# Patient Record
Sex: Female | Born: 1940 | Race: White | Hispanic: No | Marital: Married | Smoking: Never smoker
Health system: Southern US, Community
[De-identification: ages and names within clinical notes are randomized; demographics above are authoritative.]

## PROBLEM LIST (undated history)

## (undated) DIAGNOSIS — K529 Noninfective gastroenteritis and colitis, unspecified: Secondary | ICD-10-CM

## (undated) DIAGNOSIS — G250 Essential tremor: Principal | ICD-10-CM

## (undated) DIAGNOSIS — Z973 Presence of spectacles and contact lenses: Secondary | ICD-10-CM

## (undated) DIAGNOSIS — G8929 Other chronic pain: Secondary | ICD-10-CM

## (undated) DIAGNOSIS — M1990 Unspecified osteoarthritis, unspecified site: Secondary | ICD-10-CM

## (undated) DIAGNOSIS — M545 Low back pain: Secondary | ICD-10-CM

## (undated) DIAGNOSIS — E669 Obesity, unspecified: Secondary | ICD-10-CM

## (undated) DIAGNOSIS — G252 Other specified forms of tremor: Principal | ICD-10-CM

## (undated) DIAGNOSIS — K219 Gastro-esophageal reflux disease without esophagitis: Secondary | ICD-10-CM

## (undated) DIAGNOSIS — Z9889 Other specified postprocedural states: Secondary | ICD-10-CM

## (undated) DIAGNOSIS — R269 Unspecified abnormalities of gait and mobility: Secondary | ICD-10-CM

## (undated) DIAGNOSIS — R112 Nausea with vomiting, unspecified: Secondary | ICD-10-CM

## (undated) DIAGNOSIS — IMO0002 Reserved for concepts with insufficient information to code with codable children: Secondary | ICD-10-CM

## (undated) HISTORY — DX: Essential tremor: G25.0

## (undated) HISTORY — DX: Gastro-esophageal reflux disease without esophagitis: K21.9

## (undated) HISTORY — DX: Other specified forms of tremor: G25.2

## (undated) HISTORY — PX: ABDOMINAL HYSTERECTOMY: SHX81

## (undated) HISTORY — DX: Other chronic pain: G89.29

## (undated) HISTORY — DX: Unspecified abnormalities of gait and mobility: R26.9

## (undated) HISTORY — DX: Obesity, unspecified: E66.9

## (undated) HISTORY — PX: BREAST SURGERY: SHX581

## (undated) HISTORY — DX: Noninfective gastroenteritis and colitis, unspecified: K52.9

## (undated) HISTORY — PX: TONSILLECTOMY: SUR1361

## (undated) HISTORY — PX: COLONOSCOPY: SHX174

## (undated) HISTORY — DX: Low back pain: M54.5

---

## 1978-09-16 HISTORY — PX: GALLBLADDER SURGERY: SHX652

## 2002-10-25 ENCOUNTER — Encounter: Payer: Self-pay | Admitting: Neurosurgery

## 2002-10-25 ENCOUNTER — Ambulatory Visit (HOSPITAL_COMMUNITY): Admission: RE | Admit: 2002-10-25 | Discharge: 2002-10-26 | Payer: Self-pay | Admitting: Neurosurgery

## 2003-09-17 HISTORY — PX: BACK SURGERY: SHX140

## 2008-09-16 HISTORY — PX: ROTATOR CUFF REPAIR: SHX139

## 2014-04-05 ENCOUNTER — Encounter: Payer: Self-pay | Admitting: Neurology

## 2014-04-05 ENCOUNTER — Ambulatory Visit (INDEPENDENT_AMBULATORY_CARE_PROVIDER_SITE_OTHER): Payer: Medicare Other | Admitting: Neurology

## 2014-04-05 VITALS — BP 135/82 | HR 73 | Ht 64.5 in | Wt 250.0 lb

## 2014-04-05 DIAGNOSIS — R269 Unspecified abnormalities of gait and mobility: Secondary | ICD-10-CM

## 2014-04-05 DIAGNOSIS — G252 Other specified forms of tremor: Principal | ICD-10-CM

## 2014-04-05 DIAGNOSIS — R6889 Other general symptoms and signs: Secondary | ICD-10-CM

## 2014-04-05 DIAGNOSIS — G250 Essential tremor: Secondary | ICD-10-CM

## 2014-04-05 DIAGNOSIS — D518 Other vitamin B12 deficiency anemias: Secondary | ICD-10-CM

## 2014-04-05 HISTORY — DX: Essential tremor: G25.0

## 2014-04-05 NOTE — Patient Instructions (Signed)

## 2014-04-05 NOTE — Progress Notes (Signed)
Reason for visit: Tremor  Angela Frederick is a 73 y.o. female  History of present illness:  Angela Frederick is a 73 year old left-handed white female with a history of chronic low back pain and obesity. She indicates a one-year history of onset of tremor that has affected both hands. The patient will note tremor when she is trying to perform handwriting, and while feeding herself. She will also have some mild tremor at rest. Both hands are affected, but sometimes 1 hand seems worse than the other. She denies any head and neck tremor or vocal tremor. She has also noted some problems with balance of the last 4-6 months. She has fallen on 3 occasions, all falls have occurred while she was outside the house working in the garden. She now uses a cane when she is gardening. She denies any significant weakness of the extremities, but she does have some troubles with chronic diarrhea that have been present for 4 to 5 years. She denies any issues controlling the bladder. She does have some chronic low back pain. She denies any family history of tremor. The patient is sent to this office for evaluation of the tremor problems and the gait instability issues.  Past Medical History  Diagnosis Date  . 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 Obesity   . Abnormality of gait   . GERD (gastroesophageal reflux disease)   . Chronic low back pain   . Chronic diarrhea     Past Surgical History  Procedure Laterality Date  . Back surgery  2005  . Rotator cuff repair  2010    right  . Abdominal hysterectomy    . Gallbladder surgery  1980  . Tonsillectomy      Family History  Problem Relation Age of Onset  . Stroke Mother   . Leukemia Father   . Lymphoma Sister   . Stroke Sister   . Dementia Sister   . Tremor Neg Hx     Social history:  reports that she has never smoked. She has never used smokeless tobacco. She reports that she drinks alcohol. She reports that she does not use  illicit drugs.  Medications:  No current outpatient prescriptions on file prior to visit.   No current facility-administered medications on file prior to visit.      Allergies  Allergen Reactions  . Sulfa Antibiotics Hives    ROS:  Out of a complete 14 system review of symptoms, the patient complains only of the following symptoms, and all other reviewed systems are negative.  Weight gain, fatigue Dizziness, difficulty swallowing Blurred vision Shortness of breath, snoring Chronic diarrhea Feeling hot, flushing Muscle cramps, achy muscles Memory loss, weakness, difficulty swallowing, dizziness, tremor Decreased energy, change in appetite Sleepiness  Blood pressure 135/82, pulse 73, height 5' 4.5" (1.638 m), weight 250 lb (113.399 kg).  Physical Exam  General: The patient is alert and cooperative at the time of the examination. The patient is markedly obese.  Eyes: Pupils are equal, round, and reactive to light. Discs are flat bilaterally.  Neck: The neck is supple, no carotid bruits are noted.  Respiratory: The respiratory examination is clear.  Cardiovascular: The cardiovascular examination reveals a regular rate and rhythm, no obvious murmurs or rubs are noted.  Skin: Extremities are without significant edema.  Neurologic Exam  Mental status: The patient is alert and oriented x 3 at the time of the examination. The patient has apparent normal recent and remote memory, with  an apparently normal attention span and concentration ability.  Cranial nerves: Facial symmetry is present. There is good sensation of the face to pinprick and soft touch bilaterally. The strength of the facial muscles and the muscles to head turning and shoulder shrug are normal bilaterally. Speech is well enunciated, no aphasia or dysarthria is noted. Extraocular movements are full. Visual fields are full. The tongue is midline, and the patient has symmetric elevation of the soft palate. No  obvious hearing deficits are noted.  Motor: The motor testing reveals 5 over 5 strength of all 4 extremities. Good symmetric motor tone is noted throughout.  Sensory: Sensory testing is intact to pinprick, soft touch, vibration sensation, and position sense on all 4 extremities, with the exception that there may be some slight decrease in vibration sensation in both feet. No evidence of extinction is noted.  Coordination: Cerebellar testing reveals good finger-nose-finger and heel-to-shin bilaterally. Minimal tremor is seen with finger-nose-finger bilaterally.  Gait and station: Gait is slightly wide-based. Tandem gait is unsteady. Romberg is negative. No drift is seen.  Reflexes: Deep tendon reflexes are symmetric, but are depressed bilaterally. Toes are downgoing bilaterally.   Assessment/Plan:  1. 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 Gait instability  3. Chronic low back pain  The patient likely has a very mild benign essential tremor. Gait issues are often times part of the syndrome of the essential tremors, but the patient has no family history of tremor. The patient will be sent for MRI evaluation of the brain, and some further blood work today. She will followup through this office in about 4 months. Treatment of the tremor may be initiated if the patient desires medication for this issue.  Jill Alexanders MD 04/05/2014 8:32 PM  [facility] [address] [place], [place] [address]  Phone [phone] Fax [phone]

## 2014-04-09 LAB — COPPER, SERUM: Copper: 130 ug/dL (ref 72–166)

## 2014-04-09 LAB — RPR: RPR: NONREACTIVE

## 2014-04-09 LAB — VITAMIN B12: Vitamin B-12: 254 pg/mL (ref 211–946)

## 2014-04-09 LAB — VITAMIN E: Vitamin E (Alpha Tocopherol): 32.5 mg/L — ABNORMAL HIGH (ref 4.6–17.8)

## 2014-04-09 LAB — TSH: TSH: 3.56 u[IU]/mL (ref 0.450–4.500)

## 2014-04-11 DIAGNOSIS — G252 Other specified forms of tremor: Secondary | ICD-10-CM

## 2014-04-11 DIAGNOSIS — G250 Essential tremor: Secondary | ICD-10-CM

## 2014-04-11 NOTE — Progress Notes (Signed)
Quick Note:  I called pt and relayed the results of the labs ( unremarkable). Will release to mychart. Husband to set up later. ______

## 2014-04-14 ENCOUNTER — Other Ambulatory Visit: Payer: Self-pay | Admitting: Neurology

## 2014-04-14 ENCOUNTER — Telehealth: Payer: Self-pay | Admitting: Neurology

## 2014-04-14 DIAGNOSIS — G250 Essential tremor: Secondary | ICD-10-CM

## 2014-04-14 DIAGNOSIS — G252 Other specified forms of tremor: Principal | ICD-10-CM

## 2014-04-14 DIAGNOSIS — R269 Unspecified abnormalities of gait and mobility: Secondary | ICD-10-CM

## 2014-04-14 NOTE — Telephone Encounter (Signed)
I called the patient. The MRI shows minimal SVD. 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 The blood work was normal. I discussed this with the patient.   MRI brain 04/14/14:  IMPRESSION: Slightly abnormal MRI scan of the brain showing mild age-appropriate changes of chronic microvascular ischemia and generalized cerebral atrophy.

## 2014-08-03 ENCOUNTER — Encounter: Payer: Self-pay | Admitting: Neurology

## 2014-08-08 ENCOUNTER — Encounter: Payer: Self-pay | Admitting: Adult Health

## 2014-08-08 ENCOUNTER — Ambulatory Visit (INDEPENDENT_AMBULATORY_CARE_PROVIDER_SITE_OTHER): Payer: Medicare Other | Admitting: Adult Health

## 2014-08-08 VITALS — BP 132/73 | HR 79 | Temp 97.4°F | Ht 64.5 in | Wt 244.0 lb

## 2014-08-08 DIAGNOSIS — G252 Other specified forms of tremor: Principal | ICD-10-CM

## 2014-08-08 DIAGNOSIS — R269 Unspecified abnormalities of gait and mobility: Secondary | ICD-10-CM

## 2014-08-08 DIAGNOSIS — G250 Essential tremor: Secondary | ICD-10-CM

## 2014-08-08 DIAGNOSIS — R251 Tremor, unspecified: Secondary | ICD-10-CM

## 2014-08-08 NOTE — Patient Instructions (Signed)

## 2014-08-08 NOTE — Progress Notes (Signed)
PATIENT: Angela Frederick DOB: 07/20/1941  REASON FOR VISIT: follow up HISTORY FROM: patient  HISTORY OF PRESENT ILLNESS: Angela Frederick is a 73 year old female with a history of benign essential tremor. She returns today for follow-up. Patient had an MRI that showed minimal small vessel disease. All blood work was unremarkable. She is currently not on any medication of the tremor. She reports that the tremor has remained the same. She continues to notice the tremor when writing or feeding herself. The tremor is located in her hands left > right. She reports that her gait has remained about the same. Her husband reports that she will not use the cane he got her. She denies any falls since the last visit. Denies any new neurological symptoms. She states that after the first of the year she is going to have tear duct surgery. She got a steroid injection in the spine about 2 months ago that she has found beneficial. She states that they have recommended surgery but she does not want to have that done at this time.   HISTORY 04/14/14 (WILLIS): 73 year old left-handed white female with a history of chronic low back pain and obesity. She indicates a one-year history of onset of tremor that has affected both hands. The patient will note tremor when she is trying to perform handwriting, and while feeding herself. She will also have some mild tremor at rest. Both hands are affected, but sometimes 1 hand seems worse than the other. She denies any head and neck tremor or vocal tremor. She has also noted some problems with balance of the last 4-6 months. She has fallen on 3 occasions, all falls have occurred while she was outside the house working in the garden. She now uses a cane when she is gardening. She denies any significant weakness of the extremities, but she does have some troubles with chronic diarrhea that have been present for 4 to 5 years. She denies any issues controlling the bladder. She does have some  chronic low back pain. She denies any family history of tremor. The patient is sent to this office for evaluation of the tremor problems and the gait instability issues  REVIEW OF SYSTEMS: Out of a complete 14 system review of symptoms, the patient complains only of the following symptoms, and all other reviewed systems are negative.  ALLERGIES: Allergies  Allergen Reactions  . Sulfa Antibiotics Hives    HOME MEDICATIONS: Outpatient Prescriptions Prior to Visit  Medication Sig Dispense Refill  . gabapentin (NEURONTIN) 100 MG capsule Take 100 mg by mouth 3 (three) times daily. 1 am and 2 pm    . meloxicam (MOBIC) 7.5 MG tablet Take 1 tablet by mouth daily.    Marland Kitchen omeprazole (PRILOSEC) 40 MG capsule Take 40 mg by mouth daily.    Earnestine Mealing 3.75 G PACK Take 1 packet by mouth daily.    . fluorometholone (FML) 0.1 % ophthalmic suspension Place 0.1 mLs into the right eye daily.      No facility-administered medications prior to visit.    PAST MEDICAL HISTORY: Past Medical History  Diagnosis Date  . 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 Obesity   . Abnormality of gait   . GERD (gastroesophageal reflux disease)   . Chronic low back pain   . Chronic diarrhea     PAST SURGICAL HISTORY: Past Surgical History  Procedure Laterality Date  . Back surgery  2005  . Rotator cuff repair  2010  right  . Abdominal hysterectomy    . Gallbladder surgery  1980  . Tonsillectomy      FAMILY HISTORY: Family History  Problem Relation Age of Onset  . Stroke Mother   . Leukemia Father   . Lymphoma Sister   . Stroke Sister   . Dementia Sister   . Tremor Neg Hx     SOCIAL HISTORY: History   Social History  . Marital Status: Married    Spouse Name: N/A    Number of Children: 2  . Years of Education: hs   Occupational History  . Retired    Social History Main Topics  . Smoking status: Never Smoker   . Smokeless tobacco: Never Used  . Alcohol Use: Yes      Comment: occasional  . Drug Use: No  . Sexual Activity: Not on file   Other Topics Concern  . Not on file   Social History Narrative      PHYSICAL EXAM  Filed Vitals:   08/08/14 1334  BP: 132/73  Pulse: 79  Temp: 97.4 F (36.3 C)  TempSrc: Oral  Height: 5' 4.5" (1.638 m)  Weight: 244 lb (110.678 kg)   Body mass index is 41.25 kg/(m^2).  Generalized: Well developed, in no acute distress   Neurological examination  Mentation: Alert oriented to time, place, history taking. Follows all commands speech and language fluent Cranial nerve II-XII: Pupils were equal round reactive to light. Extraocular movements were full, visual field were full on confrontational test. Facial sensation and strength were normal. Uvula tongue midline. Head turning and shoulder shrug  were normal and symmetric. Motor: The motor testing reveals 5 over 5 strength of all 4 extremities. Good symmetric motor tone is noted throughout.  Sensory: Sensory testing is intact to soft touch on all 4 extremities. No evidence of extinction is noted.  Coordination: Cerebellar testing reveals good finger-nose-finger and heel-to-shin bilaterally.  Gait and station: Gait is slightly wide-based. Tandem gait unsteady. Romberg is negative. No drift is seen.  Reflexes: Deep tendon reflexes are symmetric but depressed.     DIAGNOSTIC DATA (LABS, IMAGING, TESTING) - I reviewed patient records, labs, notes, testing and imaging myself where available.   Lab Results  Component Value Date   VITAMINB12 254 04/05/2014   Lab Results  Component Value Date   TSH 3.560 04/05/2014      ASSESSMENT AND PLAN 73 y.o. 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 here with:  1. Benign essential tremor  The patient's tremor has remained the same. At this  time she does not feel that she needs medication. She also feels that her gait has remained the same. On exam her gait was slightly unsteady. I recommended that she use a cane as much as possible. She verbalized understanding. If the patient's symptoms worsen or she develops new symptoms she will let us know. Otherwise she'll follow up in 6 months or sooner if needed.   Ward Givens, MSN, NP-C 08/08/2014, 1:44 PM [facility] [address] [place], [place] [address] [phone]  Note: This document was prepared with digital dictation and possible smart phrase technology. Any transcriptional errors that result from this process are unintentional.

## 2014-08-08 NOTE — Progress Notes (Signed)
I have read the note, and I agree with the clinical assessment and plan.  Angela Frederick,Angela Frederick   

## 2014-08-09 ENCOUNTER — Encounter: Payer: Self-pay | Admitting: Neurology

## 2015-02-06 ENCOUNTER — Ambulatory Visit: Payer: Medicare Other | Admitting: Neurology

## 2015-05-11 ENCOUNTER — Ambulatory Visit: Payer: Medicare Other | Admitting: Neurology

## 2015-05-11 ENCOUNTER — Encounter: Payer: Self-pay | Admitting: Adult Health

## 2015-05-11 ENCOUNTER — Ambulatory Visit (INDEPENDENT_AMBULATORY_CARE_PROVIDER_SITE_OTHER): Payer: Medicare Other | Admitting: Adult Health

## 2015-05-11 VITALS — BP 143/84 | HR 77 | Ht 64.0 in | Wt 249.0 lb

## 2015-05-11 DIAGNOSIS — G250 Essential tremor: Secondary | ICD-10-CM | POA: Diagnosis not present

## 2015-05-11 DIAGNOSIS — R269 Unspecified abnormalities of gait and mobility: Secondary | ICD-10-CM | POA: Diagnosis not present

## 2015-05-11 DIAGNOSIS — R42 Dizziness and giddiness: Secondary | ICD-10-CM

## 2015-05-11 NOTE — Patient Instructions (Addendum)
Overall you are doing well.  If dizziness with position changes persist let us know. Please use cane when ambulating. If your symptoms worsen or you develop new symptoms please let us know.

## 2015-05-11 NOTE — Progress Notes (Signed)
PATIENT: Angela Frederick DOB: [birth year]  REASON FOR VISIT: follow up- essential tremor, gait instability HISTORY FROM: patient  HISTORY OF PRESENT ILLNESS:  Angela Frederick is a 74 year old female with a history of benign essential tremor and gait instability. She returns today for evaluation. She is currently not on any medication for her tremor. She feels that her tremor has remained about the same. She feels that the left hand is worse than the right. She typically notices it the most when she is writing or feeding herself. Patient reports that her gait has remained stable. She does have a cane and has gotten better about using it more consistently. The patient states that she did have 1 fall but that was because she hit her toe on the brick of her flower bed and it made her fall forward. She did not suffer any injuries. Patient states that in the last month she's been having dizzy episodes. She states that normally occurs when she moves her head to the right or left suddenly. She states that she typically can grab on to something and the dizziness resolves. These episodes do not occur daily. She states it may occur every few days. She has had "inner ear" before and feels that these symptoms are similar. She denies any other symptoms..She returns today for an evaluation.  HISTORY 08/08/14: Angela Frederick is a 74 year old female with a history of benign essential tremor. She returns today for follow-up. Patient had an MRI that showed minimal small vessel disease. All blood work was unremarkable. She is currently not on any medication of the tremor. She reports that the tremor has remained the same. She continues to notice the tremor when writing or feeding herself. The tremor is located in her hands left > right. She reports that her gait has remained about the same. Her husband reports that she will not use the cane he got her. She denies any falls since the last visit. Denies any new neurological  symptoms. She states that after the first of the year she is going to have tear duct surgery. She got a steroid injection in the spine about 2 months ago that she has found beneficial. She states that they have recommended surgery but she does not want to have that done at this time.   HISTORY 04/14/14 (WILLIS): 74 year old left-handed white female with a history of chronic low back pain and obesity. She indicates a one-year history of onset of tremor that has affected both hands. The patient will note tremor when she is trying to perform handwriting, and while feeding herself. She will also have some mild tremor at rest. Both hands are affected, but sometimes 1 hand seems worse than the other. She denies any head and neck tremor or vocal tremor. She has also noted some problems with balance of the last 4-6 months. She has fallen on 3 occasions, all falls have occurred while she was outside the house working in the garden. She now uses a cane when she is gardening. She denies any significant weakness of the extremities, but she does have some troubles with chronic diarrhea that have been present for 4 to 5 years. She denies any issues controlling the bladder. She does have some chronic low back pain. She denies any family history of tremor. The patient is sent to this office for evaluation of the tremor problems and the gait instability issues  REVIEW OF SYSTEMS: Out of a complete 14 system review of symptoms, the patient  complains only of the following symptoms, and all other reviewed systems are negative.  Frequent waking, daytime sleepiness, dizziness  ALLERGIES: Allergies  Allergen Reactions  . Sulfa Antibiotics Hives    HOME MEDICATIONS: Outpatient Prescriptions Prior to Visit  Medication Sig Dispense Refill  . fluorometholone (FML) 0.1 % ophthalmic suspension Place 0.1 mLs into the right eye daily.     Marland Kitchen gabapentin (NEURONTIN) 100 MG capsule Take 100 mg by mouth 3 (three) times daily. 1 am  and 2 pm    . meloxicam (MOBIC) 7.5 MG tablet Take 1 tablet by mouth daily.    Marland Kitchen omeprazole (PRILOSEC) 40 MG capsule Take 40 mg by mouth daily.    Earnestine Mealing 3.75 G PACK Take 1 packet by mouth daily.     No facility-administered medications prior to visit.    PAST MEDICAL HISTORY: Past Medical History  Diagnosis Date  . 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 Obesity   . Abnormality of gait   . GERD (gastroesophageal reflux disease)   . Chronic low back pain   . Chronic diarrhea     PAST SURGICAL HISTORY: Past Surgical History  Procedure Laterality Date  . Back surgery  2005  . Rotator cuff repair  2010    right  . Abdominal hysterectomy    . Gallbladder surgery  1980  . Tonsillectomy      FAMILY HISTORY: Family History  Problem Relation Age of Onset  . Stroke Mother   . Leukemia Father   . Lymphoma Sister   . Stroke Sister   . Dementia Sister   . Tremor Neg Hx     SOCIAL HISTORY: Social History   Social History  . Marital Status: Married    Spouse Name: N/A  . Number of Children: 2  . Years of Education: hs   Occupational History  . Retired    Social History Main Topics  . Smoking status: Never Smoker   . Smokeless tobacco: Never Used  . Alcohol Use: Yes     Comment: occasional  . Drug Use: No  . Sexual Activity: Not on file   Other Topics Concern  . Not on file   Social History Narrative      PHYSICAL EXAM  Filed Vitals:   05/11/15 1427  BP: 143/84  Pulse: 77  Height: 5\' 4"  (1.626 m)  Weight: 249 lb (112.946 kg)   Body mass index is 42.72 kg/(m^2).  Generalized: Well developed, in no acute distress   Neurological examination  Mentation: Alert oriented to time, place, history taking. Follows all commands speech and language fluent Cranial nerve II-XII: Pupils were equal round reactive to light. Extraocular movements were full, visual field were full on confrontational test. Dizziness when looking to the right.  Facial sensation and strength were normal. Uvula tongue midline. Head turning and shoulder shrug  were normal and symmetric. Motor: The motor testing reveals 5 over 5 strength of all 4 extremities. Good symmetric motor tone is noted throughout. Intention tremor noted in both hands left greater than right Sensory: Sensory testing is intact to soft touch on all 4 extremities. No evidence of extinction is noted.  Coordination: Cerebellar testing reveals good finger-nose-finger and heel-to-shin bilaterally.  Gait and station: Patient uses a cane when ambulating. Tandem gait not attempted. Romberg is negative but slightly unsteady. Reflexes: Deep tendon reflexes are symmetric and normal bilaterally.   DIAGNOSTIC DATA (LABS, IMAGING, TESTING) - I reviewed patient records, labs, notes, testing and imaging  myself where available.      ASSESSMENT AND PLAN 74 y.o. 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 here with:  1. Tremor 2. Abnormality of gait 3. Dizziness   Overall the patient is doing well. Her tremor has remained stable. We will continue to monitor. The patient was again encouraged to use her cane consistently when ambulating. The patient reports a new complaint of dizziness. Her description of her dizziness sounds as if it could possibly be positional vertigo. I recommended vestibular rehabilitation. However the patient would like to wait to see if this becomes more frequent. Patient advised that if her symptoms worsen or she develops new symptoms she should let us know. Otherwise she will follow-up in 6 months or sooner if needed.    Ward Givens, MSN, NP-C 05/11/2015, 2:26 PM [facility] [address] [place],  [address] [phone]

## 2015-05-20 NOTE — Progress Notes (Signed)
I agree with the assessment and plan. Willi [place]ak A. Felecia Shelling, MD, PhD Certified in Neurology, [place] Neurophysiology, Sleep Medicine, Pain Medicine and Neuroimaging  [facility] [address], [place], [place] [address] [phone]

## 2015-10-20 ENCOUNTER — Other Ambulatory Visit (HOSPITAL_BASED_OUTPATIENT_CLINIC_OR_DEPARTMENT_OTHER): Payer: Self-pay | Admitting: Neurosurgery

## 2015-10-20 DIAGNOSIS — M5126 Other intervertebral disc displacement, lumbar region: Secondary | ICD-10-CM

## 2015-10-28 ENCOUNTER — Ambulatory Visit (HOSPITAL_BASED_OUTPATIENT_CLINIC_OR_DEPARTMENT_OTHER)
Admission: RE | Admit: 2015-10-28 | Discharge: 2015-10-28 | Disposition: A | Discharge status: Home / Self Care | Payer: Medicare Other | Source: Ambulatory Visit | Attending: Neurosurgery | Admitting: Neurosurgery

## 2015-10-28 DIAGNOSIS — M4806 Spinal stenosis, lumbar region: Secondary | ICD-10-CM | POA: Diagnosis not present

## 2015-10-28 DIAGNOSIS — M5126 Other intervertebral disc displacement, lumbar region: Secondary | ICD-10-CM | POA: Diagnosis not present

## 2015-10-28 DIAGNOSIS — M4184 Other forms of scoliosis, thoracic region: Secondary | ICD-10-CM | POA: Diagnosis not present

## 2015-10-28 DIAGNOSIS — M8938 Hypertrophy of bone, other site: Secondary | ICD-10-CM | POA: Insufficient documentation

## 2015-10-28 DIAGNOSIS — M4186 Other forms of scoliosis, lumbar region: Secondary | ICD-10-CM | POA: Insufficient documentation

## 2015-11-06 ENCOUNTER — Other Ambulatory Visit: Payer: Self-pay | Admitting: Neurosurgery

## 2015-11-13 ENCOUNTER — Encounter: Payer: Self-pay | Admitting: Adult Health

## 2015-11-13 ENCOUNTER — Ambulatory Visit (INDEPENDENT_AMBULATORY_CARE_PROVIDER_SITE_OTHER): Payer: Medicare Other | Admitting: Adult Health

## 2015-11-13 VITALS — BP 137/85 | HR 75 | Resp 16 | Ht 64.0 in | Wt 250.0 lb

## 2015-11-13 DIAGNOSIS — G4719 Other hypersomnia: Secondary | ICD-10-CM

## 2015-11-13 DIAGNOSIS — R2681 Unsteadiness on feet: Secondary | ICD-10-CM | POA: Diagnosis not present

## 2015-11-13 DIAGNOSIS — R0683 Snoring: Secondary | ICD-10-CM | POA: Diagnosis not present

## 2015-11-13 DIAGNOSIS — G250 Essential tremor: Secondary | ICD-10-CM | POA: Diagnosis not present

## 2015-11-13 NOTE — Progress Notes (Signed)
I have read the note, and I agree with the clinical assessment and plan.  WILLIS,CHARLES KEITH   

## 2015-11-13 NOTE — Patient Instructions (Signed)
We will continue to monitor tremor Consider sleep study in the future If your symptoms worsen or you develop new symptoms please let us know.

## 2015-11-13 NOTE — Progress Notes (Signed)
PATIENT: Angela Frederick DOB: 1941/07/05  REASON FOR VISIT: follow up- essential tremor, gait instability HISTORY FROM: patient  HISTORY OF PRESENT ILLNESS: Ms. Angela is a 75 year old female with a history of benign essential tremor and gait instability. She returns today for evaluation. She feels that the tremor may have gotten slightly worse. She states that is greater in the left hand is the right. She denies any trouble eating or dressing herself. She states that her handwriting has been affected. The patient has surgery planned for the lumbar spine on Monday. This will be completed by Dr. Saintclair Halsted. She is hoping this will resolve her pain which she thinks will help her tremor as well. She states that when she is in severe pain or becomes upset or anxious her tremor is worsened. She is currently not on any medication for the tremor. Patient reports that she does have some daytime sleepiness. She states that she is to not take naps but now she does. Her husband confirms that she does snore but he is not witnessed any apnea events. The patient does have oxycodone for pain however she states that she does not take this, she has severe pain which is not frequently. She returns today for an evaluation.  HISTORY 05/11/15: Ms. Frederick is a 75 year old female with a history of benign essential tremor and gait instability. She returns today for evaluation. She is currently not on any medication for her tremor. She feels that her tremor has remained about the same. She feels that the left hand is worse than the right. She typically notices it the most when she is writing or feeding herself. Patient reports that her gait has remained stable. She does have a cane and has gotten better about using it more consistently. The patient states that she did have 1 fall but that was because she hit her toe on the brick of her flower bed and it made her fall forward. She did not suffer any injuries. Patient states that  in the last month she's been having dizzy episodes. She states that normally occurs when she moves her head to the right or left suddenly. She states that she typically can grab on to something and the dizziness resolves. These episodes do not occur daily. She states it may occur every few days. She has had "inner ear" before and feels that these symptoms are similar. She denies any other symptoms..She returns today for an evaluation.  HISTORY 08/08/14: Ms. Angela Frederick is a 75 year old female with a history of benign essential tremor. She returns today for follow-up. Patient had an MRI that showed minimal small vessel disease. All blood work was unremarkable. She is currently not on any medication of the tremor. She reports that the tremor has remained the same. She continues to notice the tremor when writing or feeding herself. The tremor is located in her hands left > right. She reports that her gait has remained about the same. Her husband reports that she will not use the cane he got her. She denies any falls since the last visit. Denies any new neurological symptoms. She states that after the first of the year she is going to have tear duct surgery. She got a steroid injection in the spine about 2 months ago that she has found beneficial. She states that they have recommended surgery but she does not want to have that done at this time.   HISTORY 04/14/14 (WILLIS): 75 year old left-handed white female with a history of  chronic low back pain and obesity. She indicates a one-year history of onset of tremor that has affected both hands. The patient will note tremor when she is trying to perform handwriting, and while feeding herself. She will also have some mild tremor at rest. Both hands are affected, but sometimes 1 hand seems worse than the other. She denies any head and neck tremor or vocal tremor. She has also noted some problems with balance of the last 4-6 months. She has fallen on 3 occasions, all falls  have occurred while she was outside the house working in the garden. She now uses a cane when she is gardening. She denies any significant weakness of the extremities, but she does have some troubles with chronic diarrhea that have been present for 4 to 5 years. She denies any issues controlling the bladder. She does have some chronic low back pain. She denies any family history of tremor. The patient is sent to this office for evaluation of the tremor problems and the gait instability issues  REVIEW OF SYSTEMS: Out of a complete 14 system review of symptoms, the patient complains only of the following symptoms, and all other reviewed systems are negative.  Weakness, tremors, back pain, walking difficulty, daytime sleepiness, snoring, diarrhea, excessive eating, fatigue  ALLERGIES: Allergies  Allergen Reactions  . Sulfa Antibiotics Hives    HOME MEDICATIONS: Outpatient Prescriptions Prior to Visit  Medication Sig Dispense Refill  . gabapentin (NEURONTIN) 100 MG capsule Take 100 mg by mouth 3 (three) times daily. 1 am and 2 pm    . meloxicam (MOBIC) 7.5 MG tablet Take 1 tablet by mouth 2 (two) times daily.     Marland Kitchen omeprazole (PRILOSEC) 40 MG capsule Take 40 mg by mouth daily.    Marland Kitchen oxyCODONE (OXY IR/ROXICODONE) 5 MG immediate release tablet Take 5 mg by mouth as needed.     Earnestine Mealing 3.75 G PACK Take 1 packet by mouth every other day.      No facility-administered medications prior to visit.    PAST MEDICAL HISTORY: Past Medical History  Diagnosis Date  . 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 Obesity   . Abnormality of gait   . GERD (gastroesophageal reflux disease)   . Chronic low back pain   . Chronic diarrhea     PAST SURGICAL HISTORY: Past Surgical History  Procedure Laterality Date  . Back surgery  2005  . Rotator cuff repair  2010    right  . Abdominal hysterectomy    . Gallbladder surgery  1980  . Tonsillectomy      FAMILY HISTORY: Family History   Problem Relation Age of Onset  . Stroke Mother   . Leukemia Father   . Lymphoma Sister   . Stroke Sister   . Dementia Sister   . Tremor Neg Hx   . Aneurysm Sister     Brain    SOCIAL HISTORY: Social History   Social History  . Marital Status: Married    Spouse Name: N/A  . Number of Children: 2  . Years of Education: hs   Occupational History  . Retired    Social History Main Topics  . Smoking status: Never Smoker   . Smokeless tobacco: Never Used  . Alcohol Use: Yes     Comment: occasional  . Drug Use: No  . Sexual Activity: Not on file   Other Topics Concern  . Not on file   Social History Narrative  PHYSICAL EXAM  Filed Vitals:   11/13/15 1258  BP: 137/85  Pulse: 75  Resp: 16  Height: 5\' 4"  (1.626 m)  Weight: 250 lb (113.399 kg)   Body mass index is 42.89 kg/(m^2).  Generalized: Well developed, in no acute distress   Neurological examination  Mentation: Alert oriented to time, place, history taking. Follows all commands speech and language fluent Cranial nerve II-XII: Pupils were equal round reactive to light. Extraocular movements were full, visual field were full on confrontational test. Facial sensation and strength were normal. Uvula tongue midline. Head turning and shoulder shrug  were normal and symmetric. Motor: The motor testing reveals 5 over 5 strength of all 4 extremities. Good symmetric motor tone is noted throughout.  Sensory: Sensory testing is intact to soft touch on all 4 extremities. No evidence of extinction is noted.  Coordination: Cerebellar testing reveals good finger-nose-finger and heel-to-shin bilaterally. Intention tremor noted in the hands bilaterally Gait and station: Gait is slightly unstable. She uses a cane when in relating. Tandem gait is unsteady. Romberg is negative. Reflexes: Deep tendon reflexes are symmetric and normal bilaterally.   DIAGNOSTIC DATA (LABS, IMAGING, TESTING) - I reviewed patient records, labs,  notes, testing and imaging myself where available.    ASSESSMENT AND PLAN 75 y.o. 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 here with:  1. Benign essential tremor 2. Gait instability 3. Snoring 4. Daytime sleepiness   The patient's tremor continues to be very mild. For now we will continue to monitor this. We will not initiate any medication at this time. Patient is amenable to this plan. The patient does have a history of snoring and daytime sleepiness. She is scheduled for surgery on Monday. We may consider a sleep study in the future. Patient is advised that if her symptoms worsen or she develops any new symptoms she she'll let us know. She will follow-up in 6 months or sooner if needed.  Ward Givens, MSN, NP-C 11/13/2015, 1:10 PM [facility] [address] [place], [place] [address] [phone]

## 2015-11-14 NOTE — Pre-Procedure Instructions (Signed)
Angela Frederick  11/14/2015      [place], [place] - [address] [place] [address] Phone: [phone] Fax: [phone]  [id number] - [place], [place] - [address] [place] [address] Phone: [phone] Fax: [phone]    Your procedure is scheduled on Monday, November 20, 2015  Report to [facility] Admitting at 7:45 A.M.  Call this number if you have problems the morning of surgery:  [phone]   Remember:  Do not eat food or drink liquids after midnight Sunday, November 19, 2015  Take these medicines the morning of surgery with A SIP OF WATER :gabapentin (NEURONTIN), loratadine (CLARITIN), omeprazole (PRILOSEC), if needed:Oxycodone for pain  Stop taking Aspirin, vitamins, fish oil, and herbal medications. Do not take any NSAIDs ie: Ibuprofen, Advil, Naproxen, BC and Goody Powder, meloxicam (MOBIC) or any medication containing Aspirin; stop now.   Do not wear jewelry, make-up or nail polish.  Do not wear lotions, powders, or perfumes.  You may not wear deodorant.  Do not shave 48 hours prior to surgery.    Do not bring valuables to the hospital.  [facility] is not responsible for any belongings or valuables.  Contacts, dentures or bridgework may not be worn into surgery.  Leave your suitcase in the car.  After surgery it may be brought to your room.  For patients admitted to the hospital, discharge time will be determined by your treatment team.  Patients discharged the day of surgery will not be allowed to drive home.   Name and phone number of your driver:  Special instructions: [facility] - Preparing for Surgery  Before surgery, you can play an important role.  Because skin is not sterile, your skin needs to be as free of germs as possible.  You can reduce the number of germs on you skin by washing with CHG (chlorahexidine gluconate) soap before surgery.  CHG is an antiseptic  cleaner which kills germs and bonds with the skin to continue killing germs even after washing.  Please DO NOT use if you have an allergy to CHG or antibacterial soaps.  If your skin becomes reddened/irritated stop using the CHG and inform your nurse when you arrive at Short Stay.  Do not shave (including legs and underarms) for at least 48 hours prior to the first CHG shower.  You may shave your face.  Please follow these instructions carefully:   1.  Shower with CHG Soap the night before surgery and the morning of Surgery.  2.  If you choose to wash your hair, wash your hair first as usual with your normal shampoo.  3.  After you shampoo, rinse your hair and body thoroughly to remove the Shampoo.  4.  Use CHG as you would any other liquid soap.  You can apply chg directly  to the skin and wash gently with scrungie or a clean washcloth.  5.  Apply the CHG Soap to your body ONLY FROM THE NECK DOWN.  Do not use on open wounds or open sores.  Avoid contact with your eyes, ears, mouth and genitals (private parts).  Wash genitals (private parts) with your normal soap.  6.  Wash thoroughly, paying special attention to the area where your surgery will be performed.  7.  Thoroughly rinse your body with warm water from the neck down.  8.  DO NOT shower/wash with your normal soap after  using and rinsing off the CHG Soap.  9.  Pat yourself dry with a clean towel.            10.  Wear clean pajamas.            11.  Place clean sheets on your bed the night of your first shower and do not sleep with pets.  Day of Surgery  Do not apply any lotions/deodorants the morning of surgery.  Please wear clean clothes to the hospital/surgery center.  Please read over the following fact sheets that you were given. Pain Booklet, Coughing and Deep Breathing, MRSA Information and Surgical Site Infection Prevention

## 2015-11-15 ENCOUNTER — Encounter (HOSPITAL_COMMUNITY)
Admission: RE | Admit: 2015-11-15 | Discharge: 2015-11-15 | Disposition: A | Discharge status: Home / Self Care | Payer: Medicare Other | Source: Ambulatory Visit | Attending: Neurosurgery | Admitting: Neurosurgery

## 2015-11-15 ENCOUNTER — Encounter (HOSPITAL_COMMUNITY): Payer: Self-pay

## 2015-11-15 DIAGNOSIS — M5126 Other intervertebral disc displacement, lumbar region: Secondary | ICD-10-CM | POA: Insufficient documentation

## 2015-11-15 DIAGNOSIS — Z01812 Encounter for preprocedural laboratory examination: Secondary | ICD-10-CM | POA: Diagnosis not present

## 2015-11-15 HISTORY — DX: Other specified postprocedural states: Z98.890

## 2015-11-15 HISTORY — DX: Presence of spectacles and contact lenses: Z97.3

## 2015-11-15 HISTORY — DX: Reserved for concepts with insufficient information to code with codable children: IMO0002

## 2015-11-15 HISTORY — DX: Unspecified osteoarthritis, unspecified site: M19.90

## 2015-11-15 HISTORY — DX: Other specified postprocedural states: R11.2

## 2015-11-15 LAB — CBC
HEMATOCRIT: 44.1 % (ref 36.0–46.0)
HEMOGLOBIN: 14.3 g/dL (ref 12.0–15.0)
MCH: 27.2 pg (ref 26.0–34.0)
MCHC: 32.4 g/dL (ref 30.0–36.0)
MCV: 83.8 fL (ref 78.0–100.0)
Platelets: 228 10*3/uL (ref 150–400)
RBC: 5.26 MIL/uL — ABNORMAL HIGH (ref 3.87–5.11)
RDW: 14 % (ref 11.5–15.5)
WBC: 8.1 10*3/uL (ref 4.0–10.5)

## 2015-11-15 LAB — BASIC METABOLIC PANEL
ANION GAP: 9 (ref 5–15)
BUN: 16 mg/dL (ref 6–20)
CO2: 25 mmol/L (ref 22–32)
Calcium: 9.5 mg/dL (ref 8.9–10.3)
Chloride: 108 mmol/L (ref 101–111)
Creatinine, Ser: 0.73 mg/dL (ref 0.44–1.00)
GFR calc Af Amer: >60 mL/min (ref >60)
GLUCOSE: 110 mg/dL — AB (ref 65–99)
POTASSIUM: 4.6 mmol/L (ref 3.5–5.1)
Sodium: 142 mmol/L (ref 135–145)

## 2015-11-15 LAB — SURGICAL PCR SCREEN
MRSA, PCR: NEGATIVE
STAPHYLOCOCCUS AUREUS: NEGATIVE

## 2015-11-15 NOTE — Progress Notes (Signed)
Pt denies SOB, chest pain, and being under the care of a cardiologist. Pt denies having a stress test, echo and cardiac cath. Pt denies having a chest x ray within the last year. Pt chart forwarded to anesthesia to review EKG.

## 2015-11-20 ENCOUNTER — Inpatient Hospital Stay (HOSPITAL_COMMUNITY): Payer: Medicare Other | Admitting: Emergency Medicine

## 2015-11-20 ENCOUNTER — Inpatient Hospital Stay (HOSPITAL_COMMUNITY)
Admission: RE | Admit: 2015-11-20 | Discharge: 2015-11-21 | DRG: 519 | Disposition: A | Discharge status: Home / Self Care | Payer: Medicare Other | Source: Ambulatory Visit | Attending: Neurosurgery | Admitting: Neurosurgery

## 2015-11-20 ENCOUNTER — Inpatient Hospital Stay (HOSPITAL_COMMUNITY): Payer: Medicare Other

## 2015-11-20 ENCOUNTER — Inpatient Hospital Stay (HOSPITAL_COMMUNITY): Payer: Medicare Other | Admitting: Certified Registered Nurse Anesthetist

## 2015-11-20 ENCOUNTER — Encounter (HOSPITAL_COMMUNITY): Payer: Self-pay | Admitting: Certified Registered Nurse Anesthetist

## 2015-11-20 ENCOUNTER — Encounter (HOSPITAL_COMMUNITY)
Admission: RE | Disposition: A | Discharge status: Home / Self Care | Payer: Self-pay | Source: Ambulatory Visit | Attending: Neurosurgery

## 2015-11-20 DIAGNOSIS — Z882 Allergy status to sulfonamides status: Secondary | ICD-10-CM | POA: Diagnosis not present

## 2015-11-20 DIAGNOSIS — M48061 Spinal stenosis, lumbar region without neurogenic claudication: Secondary | ICD-10-CM | POA: Diagnosis present

## 2015-11-20 DIAGNOSIS — Z419 Encounter for procedure for purposes other than remedying health state, unspecified: Secondary | ICD-10-CM

## 2015-11-20 DIAGNOSIS — Z791 Long term (current) use of non-steroidal anti-inflammatories (NSAID): Secondary | ICD-10-CM

## 2015-11-20 DIAGNOSIS — M549 Dorsalgia, unspecified: Secondary | ICD-10-CM | POA: Diagnosis present

## 2015-11-20 DIAGNOSIS — M5116 Intervertebral disc disorders with radiculopathy, lumbar region: Secondary | ICD-10-CM | POA: Diagnosis present

## 2015-11-20 DIAGNOSIS — M4806 Spinal stenosis, lumbar region: Secondary | ICD-10-CM | POA: Diagnosis present

## 2015-11-20 DIAGNOSIS — K219 Gastro-esophageal reflux disease without esophagitis: Secondary | ICD-10-CM | POA: Diagnosis present

## 2015-11-20 DIAGNOSIS — R251 Tremor, unspecified: Secondary | ICD-10-CM | POA: Diagnosis present

## 2015-11-20 DIAGNOSIS — Z6841 Body Mass Index (BMI) 40.0 and over, adult: Secondary | ICD-10-CM | POA: Diagnosis not present

## 2015-11-20 HISTORY — PX: LUMBAR LAMINECTOMY/DECOMPRESSION MICRODISCECTOMY: SHX5026

## 2015-11-20 SURGERY — LUMBAR LAMINECTOMY/DECOMPRESSION MICRODISCECTOMY 2 LEVELS
Anesthesia: General | Site: Spine Lumbar | Laterality: Right

## 2015-11-20 MED ORDER — LIDOCAINE-EPINEPHRINE 1 %-1:100000 IJ SOLN
INTRAMUSCULAR | Status: DC | PRN
  Administered 2015-11-20: 10 mL

## 2015-11-20 MED ORDER — SODIUM CHLORIDE 0.9 % IR SOLN
Status: DC | PRN
  Administered 2015-11-20: 10:00:00

## 2015-11-20 MED ORDER — PHENOL 1.4 % MT LIQD: 1.0000 | OROMUCOSAL | Status: DC | PRN

## 2015-11-20 MED ORDER — OXYCODONE-ACETAMINOPHEN 5-325 MG PO TABS
1.0000 | ORAL_TABLET | ORAL | Status: DC | PRN
  Administered 2015-11-20 – 2015-11-21 (×3): 2 via ORAL
  Filled 2015-11-20 (×2): qty 2

## 2015-11-20 MED ORDER — CYCLOBENZAPRINE HCL 10 MG PO TABS
10.0000 mg | ORAL_TABLET | Freq: Three times a day (TID) | ORAL | Status: DC | PRN
  Administered 2015-11-20: 10 mg via ORAL
  Filled 2015-11-20: qty 1

## 2015-11-20 MED ORDER — PANTOPRAZOLE SODIUM 40 MG PO TBEC
40.0000 mg | DELAYED_RELEASE_TABLET | Freq: Every day | ORAL | Status: DC
  Administered 2015-11-21: 40 mg via ORAL
  Filled 2015-11-20: qty 1

## 2015-11-20 MED ORDER — ROCURONIUM BROMIDE 50 MG/5ML IV SOLN
INTRAVENOUS | Status: AC
  Filled 2015-11-20: qty 1

## 2015-11-20 MED ORDER — FENTANYL CITRATE (PF) 250 MCG/5ML IJ SOLN
INTRAMUSCULAR | Status: AC
  Filled 2015-11-20: qty 5

## 2015-11-20 MED ORDER — MELOXICAM 7.5 MG PO TABS
7.5000 mg | ORAL_TABLET | Freq: Two times a day (BID) | ORAL | Status: DC
  Administered 2015-11-20 – 2015-11-21 (×2): 7.5 mg via ORAL
  Filled 2015-11-20 (×2): qty 1

## 2015-11-20 MED ORDER — NEOSTIGMINE METHYLSULFATE 10 MG/10ML IV SOLN
INTRAVENOUS | Status: AC
  Filled 2015-11-20: qty 1

## 2015-11-20 MED ORDER — DEXTROSE 5 % IV SOLN
10.0000 mg | INTRAVENOUS | Status: DC | PRN
  Administered 2015-11-20: 20 ug/min via INTRAVENOUS

## 2015-11-20 MED ORDER — METHOCARBAMOL 500 MG PO TABS
ORAL_TABLET | ORAL | Status: AC
  Filled 2015-11-20: qty 1

## 2015-11-20 MED ORDER — SUGAMMADEX SODIUM 200 MG/2ML IV SOLN
INTRAVENOUS | Status: DC | PRN
  Administered 2015-11-20: 220 mg via INTRAVENOUS

## 2015-11-20 MED ORDER — LIDOCAINE HCL (CARDIAC) 20 MG/ML IV SOLN
INTRAVENOUS | Status: AC
  Filled 2015-11-20: qty 5

## 2015-11-20 MED ORDER — CHOLESTYRAMINE 4 G PO PACK
4.0000 g | PACK | Freq: Every day | ORAL | Status: DC
  Filled 2015-11-20 (×2): qty 1

## 2015-11-20 MED ORDER — PROPOFOL 10 MG/ML IV BOLUS
INTRAVENOUS | Status: AC
  Filled 2015-11-20: qty 20

## 2015-11-20 MED ORDER — ROCURONIUM BROMIDE 50 MG/5ML IV SOLN
INTRAVENOUS | Status: AC
  Filled 2015-11-20: qty 2

## 2015-11-20 MED ORDER — PHENYLEPHRINE HCL 10 MG/ML IJ SOLN
INTRAMUSCULAR | Status: DC | PRN
  Administered 2015-11-20 (×2): 160 ug via INTRAVENOUS
  Administered 2015-11-20: 80 ug via INTRAVENOUS

## 2015-11-20 MED ORDER — BUPIVACAINE HCL (PF) 0.25 % IJ SOLN
INTRAMUSCULAR | Status: DC | PRN
  Administered 2015-11-20: 10 mL

## 2015-11-20 MED ORDER — ONDANSETRON HCL 4 MG/2ML IJ SOLN
INTRAMUSCULAR | Status: DC | PRN
  Administered 2015-11-20: 4 mg via INTRAVENOUS

## 2015-11-20 MED ORDER — PHENYLEPHRINE 40 MCG/ML (10ML) SYRINGE FOR IV PUSH (FOR BLOOD PRESSURE SUPPORT)
PREFILLED_SYRINGE | INTRAVENOUS | Status: AC
  Filled 2015-11-20: qty 20

## 2015-11-20 MED ORDER — FENTANYL CITRATE (PF) 100 MCG/2ML IJ SOLN
INTRAMUSCULAR | Status: AC
  Filled 2015-11-20: qty 2

## 2015-11-20 MED ORDER — ONDANSETRON HCL 4 MG/2ML IJ SOLN
INTRAMUSCULAR | Status: AC
  Filled 2015-11-20: qty 4

## 2015-11-20 MED ORDER — ACETAMINOPHEN 325 MG PO TABS: 650.0000 mg | ORAL_TABLET | ORAL | Status: DC | PRN

## 2015-11-20 MED ORDER — ONDANSETRON HCL 4 MG/2ML IJ SOLN
INTRAMUSCULAR | Status: AC
  Filled 2015-11-20: qty 2

## 2015-11-20 MED ORDER — OXYCODONE-ACETAMINOPHEN 5-325 MG PO TABS
ORAL_TABLET | ORAL | Status: AC
  Filled 2015-11-20: qty 2

## 2015-11-20 MED ORDER — FENTANYL CITRATE (PF) 100 MCG/2ML IJ SOLN: INTRAMUSCULAR | Status: DC | PRN

## 2015-11-20 MED ORDER — HYDROMORPHONE HCL 1 MG/ML IJ SOLN: 0.5000 mg | INTRAMUSCULAR | Status: DC | PRN

## 2015-11-20 MED ORDER — CEFAZOLIN SODIUM-DEXTROSE 2-3 GM-% IV SOLR
2.0000 g | INTRAVENOUS | Status: AC
Start: 2015-11-20 — End: 2015-11-20
  Administered 2015-11-20: 2 g via INTRAVENOUS
  Filled 2015-11-20: qty 50

## 2015-11-20 MED ORDER — EPHEDRINE SULFATE 50 MG/ML IJ SOLN
INTRAMUSCULAR | Status: AC
  Filled 2015-11-20: qty 1

## 2015-11-20 MED ORDER — MENTHOL 3 MG MT LOZG: 1.0000 | LOZENGE | OROMUCOSAL | Status: DC | PRN

## 2015-11-20 MED ORDER — GLYCOPYRROLATE 0.2 MG/ML IJ SOLN
INTRAMUSCULAR | Status: AC
  Filled 2015-11-20: qty 2

## 2015-11-20 MED ORDER — DEXAMETHASONE SODIUM PHOSPHATE 10 MG/ML IJ SOLN
10.0000 mg | INTRAMUSCULAR | Status: AC
  Administered 2015-11-20: 10 mg via INTRAVENOUS
  Filled 2015-11-20: qty 1

## 2015-11-20 MED ORDER — SODIUM CHLORIDE 0.9 % IJ SOLN
INTRAMUSCULAR | Status: AC
  Filled 2015-11-20: qty 10

## 2015-11-20 MED ORDER — MIDAZOLAM HCL 2 MG/2ML IJ SOLN
INTRAMUSCULAR | Status: AC
  Filled 2015-11-20: qty 2

## 2015-11-20 MED ORDER — 0.9 % SODIUM CHLORIDE (POUR BTL) OPTIME
TOPICAL | Status: DC | PRN
  Administered 2015-11-20: 1000 mL

## 2015-11-20 MED ORDER — ONDANSETRON HCL 4 MG/2ML IJ SOLN: 4.0000 mg | INTRAMUSCULAR | Status: DC | PRN

## 2015-11-20 MED ORDER — ACETAMINOPHEN 650 MG RE SUPP: 650.0000 mg | RECTAL | Status: DC | PRN

## 2015-11-20 MED ORDER — CEFAZOLIN SODIUM 1-5 GM-% IV SOLN
1.0000 g | Freq: Three times a day (TID) | INTRAVENOUS | Status: AC
  Administered 2015-11-20 – 2015-11-21 (×2): 1 g via INTRAVENOUS
  Filled 2015-11-20 (×2): qty 50

## 2015-11-20 MED ORDER — SODIUM CHLORIDE 0.9% FLUSH
3.0000 mL | Freq: Two times a day (BID) | INTRAVENOUS | Status: DC
  Administered 2015-11-20: 3 mL via INTRAVENOUS

## 2015-11-20 MED ORDER — SODIUM CHLORIDE 0.9% FLUSH: 3.0000 mL | INTRAVENOUS | Status: DC | PRN

## 2015-11-20 MED ORDER — DEXAMETHASONE SODIUM PHOSPHATE 10 MG/ML IJ SOLN
INTRAMUSCULAR | Status: AC
  Filled 2015-11-20: qty 1

## 2015-11-20 MED ORDER — LORATADINE 10 MG PO TABS
10.0000 mg | ORAL_TABLET | Freq: Every day | ORAL | Status: DC
  Administered 2015-11-21: 10 mg via ORAL
  Filled 2015-11-20: qty 1

## 2015-11-20 MED ORDER — THROMBIN 5000 UNITS EX SOLR
CUTANEOUS | Status: DC | PRN
  Administered 2015-11-20 (×2): 5000 [IU] via TOPICAL

## 2015-11-20 MED ORDER — FENTANYL CITRATE (PF) 100 MCG/2ML IJ SOLN
25.0000 ug | INTRAMUSCULAR | Status: DC | PRN
  Administered 2015-11-20 (×3): 50 ug via INTRAVENOUS

## 2015-11-20 MED ORDER — FENTANYL CITRATE (PF) 250 MCG/5ML IJ SOLN
INTRAMUSCULAR | Status: DC | PRN
  Administered 2015-11-20: 150 ug via INTRAVENOUS

## 2015-11-20 MED ORDER — METHOCARBAMOL 500 MG PO TABS
500.0000 mg | ORAL_TABLET | Freq: Once | ORAL | Status: AC
Start: 2015-11-20 — End: 2015-11-20
  Administered 2015-11-20: 500 mg via ORAL

## 2015-11-20 MED ORDER — GABAPENTIN 300 MG PO CAPS
300.0000 mg | ORAL_CAPSULE | Freq: Two times a day (BID) | ORAL | Status: DC
  Administered 2015-11-20 – 2015-11-21 (×2): 300 mg via ORAL
  Filled 2015-11-20 (×2): qty 1

## 2015-11-20 MED ORDER — ACETAMINOPHEN 160 MG/5ML PO SOLN
650.0000 mg | Freq: Once | ORAL | Status: AC
  Administered 2015-11-20: 650 mg via ORAL
  Filled 2015-11-20: qty 20.3

## 2015-11-20 MED ORDER — LIDOCAINE HCL (CARDIAC) 20 MG/ML IV SOLN
INTRAVENOUS | Status: DC | PRN
  Administered 2015-11-20: 100 mg via INTRAVENOUS

## 2015-11-20 MED ORDER — SUCCINYLCHOLINE CHLORIDE 20 MG/ML IJ SOLN
INTRAMUSCULAR | Status: AC
  Filled 2015-11-20: qty 1

## 2015-11-20 MED ORDER — LACTATED RINGERS IV SOLN
INTRAVENOUS | Status: DC
  Administered 2015-11-20: 09:00:00 via INTRAVENOUS

## 2015-11-20 MED ORDER — CALCIUM CARBONATE-VITAMIN D 500-200 MG-UNIT PO TABS
1.0000 | ORAL_TABLET | Freq: Every day | ORAL | Status: DC
  Administered 2015-11-20 – 2015-11-21 (×2): 1 via ORAL
  Filled 2015-11-20 (×3): qty 1

## 2015-11-20 MED ORDER — PROPOFOL 10 MG/ML IV BOLUS
INTRAVENOUS | Status: DC | PRN
  Administered 2015-11-20: 170 mg via INTRAVENOUS

## 2015-11-20 MED ORDER — LIDOCAINE HCL (CARDIAC) 20 MG/ML IV SOLN
INTRAVENOUS | Status: AC
  Filled 2015-11-20: qty 10

## 2015-11-20 MED ORDER — DEXAMETHASONE SODIUM PHOSPHATE 4 MG/ML IJ SOLN
INTRAMUSCULAR | Status: AC
  Filled 2015-11-20: qty 2

## 2015-11-20 MED ORDER — ROCURONIUM BROMIDE 100 MG/10ML IV SOLN
INTRAVENOUS | Status: DC | PRN
  Administered 2015-11-20: 50 mg via INTRAVENOUS

## 2015-11-20 MED ORDER — OXYCODONE HCL 5 MG PO TABS: 5.0000 mg | ORAL_TABLET | Freq: Four times a day (QID) | ORAL | Status: DC | PRN

## 2015-11-20 SURGICAL SUPPLY — 51 items
BAG DECANTER FOR FLEXI CONT (MISCELLANEOUS) ×3 IMPLANT
BENZOIN TINCTURE PRP APPL 2/3 (GAUZE/BANDAGES/DRESSINGS) ×3 IMPLANT
BLADE CLIPPER SURG (BLADE) IMPLANT
BLADE SURG 11 STRL SS (BLADE) ×3 IMPLANT
BRUSH SCRUB EZ PLAIN DRY (MISCELLANEOUS) ×3 IMPLANT
BUR MATCHSTICK NEURO 3.0 LAGG (BURR) ×3 IMPLANT
BUR PRECISION FLUTE 6.0 (BURR) ×3 IMPLANT
CANISTER SUCT 3000ML PPV (MISCELLANEOUS) ×3 IMPLANT
CLOSURE WOUND 1/2 X4 (GAUZE/BANDAGES/DRESSINGS) ×1
DECANTER SPIKE VIAL GLASS SM (MISCELLANEOUS) ×3 IMPLANT
DRAPE LAPAROTOMY 100X72X124 (DRAPES) ×3 IMPLANT
DRAPE MICROSCOPE LEICA (MISCELLANEOUS) ×3 IMPLANT
DRAPE POUCH INSTRU U-SHP 10X18 (DRAPES) ×3 IMPLANT
DRAPE PROXIMA HALF (DRAPES) IMPLANT
DRAPE SURG 17X23 STRL (DRAPES) ×3 IMPLANT
DRSG OPSITE 4X5.5 SM (GAUZE/BANDAGES/DRESSINGS) ×3 IMPLANT
DRSG OPSITE POSTOP 4X6 (GAUZE/BANDAGES/DRESSINGS) ×3 IMPLANT
DURAPREP 26ML APPLICATOR (WOUND CARE) ×3 IMPLANT
ELECT REM PT RETURN 9FT ADLT (ELECTROSURGICAL) ×3
ELECTRODE REM PT RTRN 9FT ADLT (ELECTROSURGICAL) ×1 IMPLANT
GAUZE SPONGE 4X4 12PLY STRL (GAUZE/BANDAGES/DRESSINGS) ×3 IMPLANT
GAUZE SPONGE 4X4 16PLY XRAY LF (GAUZE/BANDAGES/DRESSINGS) IMPLANT
GLOVE BIO SURGEON STRL SZ8 (GLOVE) ×3 IMPLANT
GLOVE ECLIPSE 7.5 STRL STRAW (GLOVE) IMPLANT
GLOVE EXAM NITRILE LRG STRL (GLOVE) IMPLANT
GLOVE EXAM NITRILE MD LF STRL (GLOVE) IMPLANT
GLOVE EXAM NITRILE XL STR (GLOVE) IMPLANT
GLOVE EXAM NITRILE XS STR PU (GLOVE) IMPLANT
GLOVE INDICATOR 8.5 STRL (GLOVE) ×3 IMPLANT
GOWN STRL REUS W/ TWL LRG LVL3 (GOWN DISPOSABLE) ×1 IMPLANT
GOWN STRL REUS W/ TWL XL LVL3 (GOWN DISPOSABLE) ×2 IMPLANT
GOWN STRL REUS W/TWL 2XL LVL3 (GOWN DISPOSABLE) IMPLANT
GOWN STRL REUS W/TWL LRG LVL3 (GOWN DISPOSABLE) ×2
GOWN STRL REUS W/TWL XL LVL3 (GOWN DISPOSABLE) ×4
KIT BASIN OR (CUSTOM PROCEDURE TRAY) ×3 IMPLANT
KIT ROOM TURNOVER OR (KITS) ×3 IMPLANT
LIQUID BAND (GAUZE/BANDAGES/DRESSINGS) ×3 IMPLANT
NEEDLE HYPO 22GX1.5 SAFETY (NEEDLE) ×3 IMPLANT
NEEDLE SPNL 22GX3.5 QUINCKE BK (NEEDLE) ×3 IMPLANT
NS IRRIG 1000ML POUR BTL (IV SOLUTION) ×3 IMPLANT
PACK LAMINECTOMY NEURO (CUSTOM PROCEDURE TRAY) ×3 IMPLANT
RUBBERBAND STERILE (MISCELLANEOUS) ×6 IMPLANT
SPONGE SURGIFOAM ABS GEL SZ50 (HEMOSTASIS) ×3 IMPLANT
STRIP CLOSURE SKIN 1/2X4 (GAUZE/BANDAGES/DRESSINGS) ×2 IMPLANT
SUT VIC AB 0 CT1 18XCR BRD8 (SUTURE) ×1 IMPLANT
SUT VIC AB 0 CT1 8-18 (SUTURE) ×2
SUT VIC AB 2-0 CT1 18 (SUTURE) ×3 IMPLANT
SUT VICRYL 4-0 PS2 18IN ABS (SUTURE) ×3 IMPLANT
TOWEL OR 17X24 6PK STRL BLUE (TOWEL DISPOSABLE) ×3 IMPLANT
TOWEL OR 17X26 10 PK STRL BLUE (TOWEL DISPOSABLE) ×3 IMPLANT
WATER STERILE IRR 1000ML POUR (IV SOLUTION) ×3 IMPLANT

## 2015-11-20 NOTE — Op Note (Signed)
Preoperative diagnosis: Lumbar spinal stenosis L3-4 with lateral recess stenosis and right-sided L3 and L4 radiculopathy  2 herniated nucleus pulposus L4-5 on the right with right-sided L5 radiculopathies  Postoperative diagnosis: Same  Procedure: #1 right-sided decompressive lumbar laminotomy L3-4 partial medial facetectomies and foraminotomies of the L3 and L4 nerve root  #2 lumbar laminectomy microscope discectomy L4-5 on the right with microdissection of the right L5 nerve root microscopic discectomy  Surgeon: Dominica Severin Herman Mell  Asst.: Marland Kitchen ditty  Anesthesia: Gen.  EBL: Minimal  History of present illness: Patient is a very pleasant 75 year old female has had long-standing back pain is developing a progress worsening right greater than left leg pain with radiation down L4 and L5 nerve root pattern. Workup revealed severe lumbar spinal stenosis at L3-4 and herniated is a process at L4-5. Due to her dominance of right-sided symptoms I recommended decompressive lumbar laminectomy at L3-4 from the right and a right-sided L4-5 discectomy. I extensively went over the risks and benefits of the operation with the patient as well as perioperative course expectations of outcome and alternatives of surgery and she understood and agreed to proceed forward.  Operative procedure: Patient brought into the or was induced under general anesthesia positioned prone the Wilson frame her back was prepped and draped in routine sterile fashion. She had an old incision over L5-S1 so after infiltration of 10 mL lidocaine with epi a midline incision was made and Bovie left car was used to cut subcutaneous tissues and subperiosteal dissections care lamina of L3, L4, and L5. Interoperative x-ray confirmed with navigation proper level. So the entire lamina of L4 was drilled down part of the superior aspect of lamina of L5 and part of the inferior aspect of lamina of L3 were all removed in piecemeal fashion with 3 and 4 mm  Kerrison punch. There was marked ligamentous hypertrophy causing severe hourglass compression of thecal sac at both L3-4 and L4-5. Under microscopic illumination this was teased off of the dura with a 4 Penfield and under bitten with a 2 and 3 mm Kerrison punch. A large spur was removed from the L3-4 facet causing severe stenosis of the dorsal aspect of the L4 nerve root. Marching up I extended laminotomy superiorly well  above the 34 disc space and up to the level of the L3 foramen decompressing this foramen. up until is saw epidural fat with no further stenosis. Then marching inferiorly unroofed further the L4 foramen continue to march down identified the L5 pedicle and unroofed the L5 nerve root flush with the L5 pedicle. Then I dissected the L5 nerve root off the disc was a large herniations still partially contained within the ligament I incised the ligament removed several large free fragments of disc been into the disc space and cleaned up this. At the end the discectomy there is no further stenosis either centrally or foraminally I inspected the L3 and L4 foramens as well and confirmed adequate decompression. There was a go see her good fixing space was maintained Gelfoam was overlaid top of the dura a medium Hemovac drain was placed and the wounds closed in layers with interrupted Vicryl and skin was closed running 4 subcuticular Dermabond benzo and Steri-Strips were all applied and patient recovered in stable condition. At the end of the case all needle counts and sponge counts were correct.

## 2015-11-20 NOTE — Transfer of Care (Signed)
Immediate Anesthesia Transfer of Care Note  Patient: Angela Frederick  Procedure(s) Performed: Procedure(s): Laminectomy and Foraminotomy - L4-L5 - L3-L4 - right (Right)  Patient Location: PACU  Anesthesia Type:General  Level of Consciousness: lethargic and responds to stimulation  Airway & Oxygen Therapy: Patient Spontanous Breathing and Patient connected to nasal cannula oxygen  Post-op Assessment: Report given to RN and Patient moving all extremities X 4  Post vital signs: Reviewed and stable  Last Vitals:  Filed Vitals:   11/20/15 0809  BP: 125/75  Pulse: 76  Temp: 123XX123 C    Complications: No apparent anesthesia complications

## 2015-11-20 NOTE — Anesthesia Preprocedure Evaluation (Addendum)
Anesthesia Evaluation  Patient identified by MRN, date of birth, ID band Patient awake    Reviewed: Allergy & Precautions, H&P , Patient's Chart, lab work & pertinent test results, reviewed documented beta blocker date and time   History of Anesthesia Complications (+) PONV  Airway Mallampati: II  TM Distance: >3 FB Neck ROM: full    Dental no notable dental hx. (+) Teeth Intact, Dental Advisory Given   Pulmonary    Pulmonary exam normal breath sounds clear to auscultation       Cardiovascular  Rhythm:regular Rate:Normal     Neuro/Psych    GI/Hepatic GERD  Medicated and Controlled,  Endo/Other  Morbid obesity  Renal/GU      Musculoskeletal   Abdominal   Peds  Hematology   Anesthesia Other Findings   Reproductive/Obstetrics                            Anesthesia Physical Anesthesia Plan  ASA: III  Anesthesia Plan: General   Post-op Pain Management:    Induction: Intravenous  Airway Management Planned: Oral ETT  Additional Equipment:   Intra-op Plan:   Post-operative Plan: Extubation in OR  Informed Consent: I have reviewed the patients History and Physical, chart, labs and discussed the procedure including the risks, benefits and alternatives for the proposed anesthesia with the patient or authorized representative who has indicated his/her understanding and acceptance.   Dental Advisory Given and Dental advisory given  Plan Discussed with: CRNA and Surgeon  Anesthesia Plan Comments: (  Discussed general anesthesia, including possible nausea, instrumentation of airway, sore throat,pulmonary aspiration, etc. I asked if the were any outstanding questions, or  concerns before we proceeded. )        Anesthesia Quick Evaluation

## 2015-11-20 NOTE — H&P (Signed)
Ellsa Cebulski is an 75 y.o. female.   Chief Complaint: Back and right greater than the left leg pain HPI: Patient is a very pleasant 75 year old female who is progressive worsening back and bilateral leg pain worse on the right workup has revealed severe spinal stenosis at L3-4 and a large herniated nucleus pulposus at L4-5 on the right. Due to this is a conservative treatment imaging findings and progressive clinical syndrome I recommended decompressive laminectomy at L3-4 on the right and lumbar laminectomy microdiscectomy L4-5 on the right. I've extensively gone over the risks and benefits of the operation of patient as well as perioperative course expectations of outcome and alternatives of surgery and she understood and agreed to proceed forward.  Past Medical History  Diagnosis Date  . 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 Obesity   . Abnormality of gait   . GERD (gastroesophageal reflux disease)   . Chronic low back pain   . Chronic diarrhea   . PONV (postoperative nausea and vomiting)   . Wears glasses   . Herniated nucleus pulposus   . Arthritis     Past Surgical History  Procedure Laterality Date  . Back surgery  2005  . Rotator cuff repair  2010    right  . Abdominal hysterectomy    . Gallbladder surgery  1980  . Tonsillectomy    . Breast surgery      lumpectomy and biopsy  . Colonoscopy      Family History  Problem Relation Age of Onset  . Stroke Mother   . Leukemia Father   . Lymphoma Sister   . Stroke Sister   . Dementia Sister   . Tremor Neg Hx   . Aneurysm Sister     Brain   Social History:  reports that she has never smoked. She has never used smokeless tobacco. She reports that she drinks alcohol. She reports that she does not use illicit drugs.  Allergies:  Allergies  Allergen Reactions  . Sulfa Antibiotics Hives    Medications Prior to Admission  Medication Sig Dispense Refill  . Calcium Carbonate-Vitamin D 600-400 MG-UNIT  tablet Take 1 tablet by mouth daily.    . cholestyramine (QUESTRAN) 4 g packet Take 4 g by mouth daily.    Marland Kitchen gabapentin (NEURONTIN) 300 MG capsule Take 300 mg by mouth 2 (two) times daily.    Marland Kitchen loratadine (CLARITIN) 10 MG tablet Take 10 mg by mouth daily.    . meloxicam (MOBIC) 7.5 MG tablet Take 1 tablet by mouth 2 (two) times daily.     Marland Kitchen omeprazole (PRILOSEC) 40 MG capsule Take 40 mg by mouth daily.    Marland Kitchen oxyCODONE (OXY IR/ROXICODONE) 5 MG immediate release tablet Take 5 mg by mouth every 6 (six) hours as needed for moderate pain or severe pain.       No results found for this or any previous visit (from the past 48 hour(s)). No results found.  Review of Systems  Constitutional: Negative.   HENT: Negative.   Eyes: Negative.   Respiratory: Negative.   Cardiovascular: Negative.   Gastrointestinal: Negative.   Genitourinary: Negative.   Musculoskeletal: Positive for myalgias and back pain.  Skin: Negative.   Neurological: Positive for tingling and sensory change.  Psychiatric/Behavioral: Negative.     Blood pressure 125/75, pulse 76, temperature 98 F (36.7 C), temperature source Oral, height 5' 4.5" (1.638 m), weight 113.399 kg (250 lb), SpO2 95 %. Physical Exam  Constitutional: She is  oriented to person, place, and time. She appears well-developed and well-nourished.  HENT:  Head: Normocephalic.  Eyes: Pupils are equal, round, and reactive to light.  Neck: Normal range of motion.  Respiratory: Effort normal.  GI: Soft. Bowel sounds are normal.  Neurological: She is alert and oriented to person, place, and time. She has normal strength. GCS eye subscore is 4. GCS verbal subscore is 5. GCS motor subscore is 6.  Strength is 5 out of 5 in her iliopsoas, quads, hemorrhages, gastric, and tibialis, EHL.  Skin: Skin is warm and dry.     Assessment/Plan 75 year old female presents for decompressive laminectomy on the right at L3-4 and lumbar laminectomy microdiscectomy at L4-5 on the  right  Lindi Abram P, MD 11/20/2015, 9:29 AM

## 2015-11-20 NOTE — Anesthesia Postprocedure Evaluation (Signed)
Anesthesia Post Note  Patient: Nutritional therapist  Procedure(s) Performed: Procedure(s) (LRB): Laminectomy and Foraminotomy - L4-L5 - L3-L4 - right Lumbar Four-Five Discectomy with Lumbar Three-Four Decompression (Right)  Patient location during evaluation: PACU Anesthesia Type: General Level of consciousness: awake and alert Pain management: pain level controlled Vital Signs Assessment: post-procedure vital signs reviewed and stable Respiratory status: spontaneous breathing, nonlabored ventilation and respiratory function stable Cardiovascular status: blood pressure returned to baseline and stable Postop Assessment: no signs of nausea or vomiting Anesthetic complications: no    Last Vitals:  Filed Vitals:   11/20/15 1410 11/20/15 1609  BP: 132/74 109/55  Pulse: 75 70  Temp: 36.7 C 36.5 C  Resp: 20 18    Last Pain:  Filed Vitals:   11/20/15 1610  PainSc: 5                  Jhane Lorio A

## 2015-11-20 NOTE — Anesthesia Procedure Notes (Signed)
Procedure Name: Intubation Date/Time: 11/20/2015 9:51 AM Performed by: Barrington Ellison Pre-anesthesia Checklist: Patient identified, Emergency Drugs available, Suction available, Patient being monitored and Timeout performed Patient Re-evaluated:Patient Re-evaluated prior to inductionOxygen Delivery Method: Circle system utilized Preoxygenation: Pre-oxygenation with 100% oxygen Intubation Type: IV induction Ventilation: Mask ventilation without difficulty Laryngoscope Size: Mac and 3 Grade View: Grade II Tube type: Oral Tube size: 7.0 mm Number of attempts: 1 Airway Equipment and Method: Stylet Placement Confirmation: ETT inserted through vocal cords under direct vision,  positive ETCO2 and breath sounds checked- equal and bilateral Secured at: 21 cm Tube secured with: Tape Dental Injury: Teeth and Oropharynx as per pre-operative assessment

## 2015-11-21 ENCOUNTER — Encounter (HOSPITAL_COMMUNITY): Payer: Self-pay | Admitting: Neurosurgery

## 2015-11-21 NOTE — Progress Notes (Signed)
Patient ID: Angela Frederick, female   DOB: 07-03-1941, 75 y.o.   MRN: [id number] Doing well no leg pain discharged home

## 2015-11-21 NOTE — Discharge Instructions (Signed)

## 2015-11-21 NOTE — Discharge Summary (Signed)
  Physician Discharge Summary  Patient ID: Angela Frederick MRN: [id number] DOB/AGE: 75-Oct-1942 75 y.o.  Admit date: 11/20/2015 Discharge date: 11/21/2015  Admission Diagnoses: Lumbar spinal stenosis and herniated nevus versus L3-4 L4-5  Discharge Diagnoses: Samegood Active Problems:   Spinal stenosis at L4-L5 level   Discharged Condition: good  Hospital Course: Patient is [facility] underwent decompressive laminectomy at L3-4 and a lumbar discectomy L4-5 postoperative patient did very well with recovered in the floor on the floor she was angling and voiding spontaneously tolerating regular diet was stable for discharge home  Consults: Significant Diagnostic Studies: Treatments: Decompressive lumbar laminectomy L3-4 lumbar microscopic discectomy L4-5 on the right Discharge Exam: Blood pressure 101/53, pulse 64, temperature 98.2 F (36.8 C), temperature source Oral, resp. rate 18, height 5' 4.5" (1.638 m), weight 113.399 kg (250 lb), SpO2 93 %. Strength 5 out of 5 wound clean dry and intact  Disposition: Home     Medication List    TAKE these medications        Calcium Carbonate-Vitamin D 600-400 MG-UNIT tablet  Take 1 tablet by mouth daily.     cholestyramine 4 g packet  Commonly known as:  QUESTRAN  Take 4 g by mouth daily.     gabapentin 300 MG capsule  Commonly known as:  NEURONTIN  Take 300 mg by mouth 2 (two) times daily.     loratadine 10 MG tablet  Commonly known as:  CLARITIN  Take 10 mg by mouth daily.     meloxicam 7.5 MG tablet  Commonly known as:  MOBIC  Take 1 tablet by mouth 2 (two) times daily.     omeprazole 40 MG capsule  Commonly known as:  PRILOSEC  Take 40 mg by mouth daily.     oxyCODONE 5 MG immediate release tablet  Commonly known as:  Oxy IR/ROXICODONE  Take 5 mg by mouth every 6 (six) hours as needed for moderate pain or severe pain.           Follow-up Information    Follow up with [facility], MD.   Specialty:   Neurosurgery   Contact information:   [address] [place] [address] [phone]       Signed: Elaina Hoops 11/21/2015, 7:31 AM

## 2015-11-21 NOTE — Progress Notes (Signed)
Patient alert and oriented, mae's well, voiding adequate amount of urine, swallowing without difficulty, no c/o pain. Patient discharged home with family. Script and discharged instructions given to patient. Patient and family stated understanding of d/c instructions given and has an appointment with MD. 

## 2016-04-23 ENCOUNTER — Other Ambulatory Visit (HOSPITAL_BASED_OUTPATIENT_CLINIC_OR_DEPARTMENT_OTHER): Payer: Self-pay | Admitting: Neurosurgery

## 2016-04-23 DIAGNOSIS — M5126 Other intervertebral disc displacement, lumbar region: Secondary | ICD-10-CM

## 2016-04-27 ENCOUNTER — Ambulatory Visit (HOSPITAL_BASED_OUTPATIENT_CLINIC_OR_DEPARTMENT_OTHER)
Admission: RE | Admit: 2016-04-27 | Discharge: 2016-04-27 | Disposition: A | Discharge status: Home / Self Care | Payer: Medicare Other | Source: Ambulatory Visit | Attending: Neurosurgery | Admitting: Neurosurgery

## 2016-04-27 ENCOUNTER — Ambulatory Visit (HOSPITAL_BASED_OUTPATIENT_CLINIC_OR_DEPARTMENT_OTHER): Payer: Medicare Other

## 2016-04-27 DIAGNOSIS — M4316 Spondylolisthesis, lumbar region: Secondary | ICD-10-CM | POA: Diagnosis not present

## 2016-04-27 DIAGNOSIS — M47896 Other spondylosis, lumbar region: Secondary | ICD-10-CM | POA: Insufficient documentation

## 2016-04-27 DIAGNOSIS — M4807 Spinal stenosis, lumbosacral region: Secondary | ICD-10-CM | POA: Insufficient documentation

## 2016-04-27 DIAGNOSIS — M4806 Spinal stenosis, lumbar region: Secondary | ICD-10-CM | POA: Diagnosis not present

## 2016-04-27 DIAGNOSIS — R590 Localized enlarged lymph nodes: Secondary | ICD-10-CM | POA: Insufficient documentation

## 2016-04-27 DIAGNOSIS — M47895 Other spondylosis, thoracolumbar region: Secondary | ICD-10-CM | POA: Insufficient documentation

## 2016-04-27 DIAGNOSIS — M5126 Other intervertebral disc displacement, lumbar region: Secondary | ICD-10-CM | POA: Diagnosis present

## 2016-04-27 MED ORDER — GADOBENATE DIMEGLUMINE 529 MG/ML IV SOLN: 20.0000 mL | Freq: Once | INTRAVENOUS | Status: DC | PRN

## 2016-05-04 ENCOUNTER — Other Ambulatory Visit (HOSPITAL_BASED_OUTPATIENT_CLINIC_OR_DEPARTMENT_OTHER): Payer: Medicare Other

## 2016-05-14 ENCOUNTER — Ambulatory Visit (INDEPENDENT_AMBULATORY_CARE_PROVIDER_SITE_OTHER): Payer: Medicare Other | Admitting: Neurology

## 2016-05-14 ENCOUNTER — Encounter: Payer: Self-pay | Admitting: Neurology

## 2016-05-14 VITALS — BP 132/75 | HR 72 | Ht 64.5 in | Wt 241.0 lb

## 2016-05-14 DIAGNOSIS — G250 Essential tremor: Secondary | ICD-10-CM | POA: Diagnosis not present

## 2016-05-14 DIAGNOSIS — R269 Unspecified abnormalities of gait and mobility: Secondary | ICD-10-CM | POA: Diagnosis not present

## 2016-05-14 MED ORDER — GABAPENTIN 300 MG PO CAPS: ORAL_CAPSULE | ORAL | 1 refills | Status: DC

## 2016-05-14 NOTE — Patient Instructions (Addendum)
Fall Prevention in the Home  Falls can cause injuries and can affect people from all age groups. There are many simple things that you can do to make your home safe and to help prevent falls. WHAT CAN I DO ON THE OUTSIDE OF MY HOME?  Regularly repair the edges of walkways and driveways and fix any cracks.  Remove high doorway thresholds.  Trim any shrubbery on the main path into your home.  Use bright outdoor lighting.  Clear walkways of debris and clutter, including tools and rocks.  Regularly check that handrails are securely fastened and in good repair. Both sides of any steps should have handrails.  Install guardrails along the edges of any raised decks or porches.  Have leaves, snow, and ice cleared regularly.  Use sand or salt on walkways during winter months.  In the garage, clean up any spills right away, including grease or oil spills. WHAT CAN I DO IN THE BATHROOM?  Use night lights.  Install grab bars by the toilet and in the tub and shower. Do not use towel bars as grab bars.  Use non-skid mats or decals on the floor of the tub or shower.  If you need to sit down while you are in the shower, use a plastic, non-slip stool..  Keep the floor dry. Immediately clean up any water that spills on the floor.  Remove soap buildup in the tub or shower on a regular basis.  Attach bath mats securely with double-sided non-slip rug tape.  Remove throw rugs and other tripping hazards from the floor. WHAT CAN I DO IN THE BEDROOM?  Use night lights.  Make sure that a bedside light is easy to reach.  Do not use oversized bedding that drapes onto the floor.  Have a firm chair that has side arms to use for getting dressed.  Remove throw rugs and other tripping hazards from the floor. WHAT CAN I DO IN THE KITCHEN?   Clean up any spills right away.  Avoid walking on wet floors.  Place frequently used items in easy-to-reach places.  If you need to reach for something  above you, use a sturdy step stool that has a grab bar.  Keep electrical cables out of the way.  Do not use floor polish or wax that makes floors slippery. If you have to use wax, make sure that it is non-skid floor wax.  Remove throw rugs and other tripping hazards from the floor. WHAT CAN I DO IN THE STAIRWAYS?  Do not leave any items on the stairs.  Make sure that there are handrails on both sides of the stairs. Fix handrails that are broken or loose. Make sure that handrails are as long as the stairways.  Check any carpeting to make sure that it is firmly attached to the stairs. Fix any carpet that is loose or worn.  Avoid having throw rugs at the top or bottom of stairways, or secure the rugs with carpet tape to prevent them from moving.  Make sure that you have a light switch at the top of the stairs and the bottom of the stairs. If you do not have them, have them installed. WHAT ARE SOME OTHER FALL PREVENTION TIPS?  Wear closed-toe shoes that fit well and support your feet. Wear shoes that have rubber soles or low heels.  When you use a stepladder, make sure that it is completely opened and that the sides are firmly locked. Have someone hold the ladder while you   are using it. Do not climb a closed stepladder.  Add color or contrast paint or tape to grab bars and handrails in your home. Place contrasting color strips on the first and last steps.  Use mobility aids as needed, such as canes, walkers, scooters, and crutches.  Turn on lights if it is dark. Replace any light bulbs that burn out.  Set up furniture so that there are clear paths. Keep the furniture in the same spot.  Fix any uneven floor surfaces.  Choose a carpet design that does not hide the edge of steps of a stairway.  Be aware of any and all pets.  Review your medicines with your healthcare provider. Some medicines can cause dizziness or changes in blood pressure, which increase your risk of falling. Talk  with your health care provider about other ways that you can decrease your risk of falls. This may include working with a physical therapist or trainer to improve your strength, balance, and endurance.   This information is not intended to replace advice given to you by your health care provider. Make sure you discuss any questions you have with your health care provider.   Document Released: 08/23/2002 Document Revised: 01/17/2015 Document Reviewed: 10/07/2014 Elsevier Interactive Patient Education 2016 Elsevier Inc.  

## 2016-05-14 NOTE — Progress Notes (Signed)
Reason for visit: Tremor  Angela Frederick is an 75 y.o. female  History of present illness:  Angela Frederick is a 75 year old left-handed white female with a history of a benign essential tremor. The patient also has chronic low back pain, she is followed by Dr. Saintclair Halsted for this. The patient has had some increase in back pain and she has had a recent MRI of the low back. The patient continues to have some gradual worsening of the tremor affecting both upper extremities. She has difficulty with handwriting and feeding herself. She does have some balance issues, she uses a cane for ambulation, she denies any recent falls. She does not sleep well at night because of the pain, and therefore she has increased drowsiness during the day. The patient may nap while she is reading a book or when she is inactive during the day. The patient returns for an evaluation. She is on gabapentin taking 300 mg in the morning and 600 mg in the evening for the low back pain.  Past Medical History:  Diagnosis Date  . Abnormality of gait   . Arthritis   . Chronic diarrhea   . Chronic low back pain   . 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 GERD (gastroesophageal reflux disease)   . Herniated nucleus pulposus   . Obesity   . PONV (postoperative nausea and vomiting)   . Wears glasses     Past Surgical History:  Procedure Laterality Date  . ABDOMINAL HYSTERECTOMY    . BACK SURGERY  2005  . BREAST SURGERY     lumpectomy and biopsy  . COLONOSCOPY    . [place]  . LUMBAR LAMINECTOMY/DECOMPRESSION MICRODISCECTOMY Right 11/20/2015   Procedure: Laminectomy and Foraminotomy - L4-L5 - L3-L4 - right Lumbar Four-Five Discectomy with Lumbar Three-Four Decompression;  Surgeon: Kary Kos, MD;  Location: [facility];  Service: Neurosurgery;  Laterality: Right;  . ROTATOR CUFF REPAIR  2010   right  . TONSILLECTOMY      Family History  Problem Relation Age of Onset  . Stroke Mother     . Leukemia Father   . Lymphoma Sister   . Stroke Sister   . Dementia Sister   . Tremor Neg Hx   . Aneurysm Sister     Brain    Social history:  reports that she has never smoked. She has never used smokeless tobacco. She reports that she drinks alcohol. She reports that she does not use drugs.    Allergies  Allergen Reactions  . Sulfa Antibiotics Hives    Medications:  Prior to Admission medications   Medication Sig Start Date End Date Taking? Authorizing Provider  Calcium Carbonate-Vitamin D 600-400 MG-UNIT tablet Take 1 tablet by mouth daily.   Yes Historical Provider, MD  cholestyramine (QUESTRAN) 4 g packet Take 4 g by mouth daily.   Yes Historical Provider, MD  gabapentin (NEURONTIN) 300 MG capsule Take 300 mg by mouth 2 (two) times daily.   Yes Historical Provider, MD  loratadine (CLARITIN) 10 MG tablet Take 10 mg by mouth daily.   Yes Historical Provider, MD  meloxicam (MOBIC) 7.5 MG tablet Take 1 tablet by mouth 2 (two) times daily.  01/27/14  Yes Historical Provider, MD  omeprazole (PRILOSEC) 40 MG capsule Take 40 mg by mouth daily.   Yes Historical Provider, MD  oxyCODONE (OXY IR/ROXICODONE) 5 MG immediate release tablet Take 5 mg by mouth every 6 (six) hours as needed  for moderate pain or severe pain.  05/31/14  Yes Historical Provider, MD    ROS:  Out of a complete 14 system review of symptoms, the patient complains only of the following symptoms, and all other reviewed systems are negative.  Difficulty swallowing Rectal bleeding, diarrhea Daytime sleepiness Back pain  Blood pressure 132/75, pulse 72, height 5' 4.5" (1.638 m), weight 241 lb (109.3 kg).  Physical Exam  General: The patient is alert and cooperative at the time of the examination. The patient is markedly obese.  Skin: 1+ edema at the ankles is noted..   Neurologic Exam  Mental status: The patient is alert and oriented x 3 at the time of the examination. The patient has apparent normal recent  and remote memory, with an apparently normal attention span and concentration ability.   Cranial nerves: Facial symmetry is present. Speech is normal, no aphasia or dysarthria is noted. Extraocular movements are full. Visual fields are full.  Motor: The patient has good strength in all 4 extremities.  Sensory examination: Soft touch sensation is symmetric on the face, arms, and legs.  Coordination: The patient has good finger-nose-finger and heel-to-shin bilaterally. There is an intention tremor with finger-nose-finger bilaterally. The patient has minimal tremor withdrawn a spiral.  Gait and station: The patient has a slightly wide-based gait, the patient uses a cane for ambulation. Tandem gait was not attempted. Romberg is unsteady.  Reflexes: Deep tendon reflexes are symmetric, but are depressed.   Assessment/Plan:  1. Benign essential tremor  2. Gait disorder  3. Chronic low back pain  The patient is on gabapentin for her low back pain, we may increase this for the back and for the tremor. She will go to 300 mg in the morning at midday, 600 mg in the evening. A prescription was written for the gabapentin. She will follow-up in 6 months.  Jill Alexanders MD 05/14/2016 11:26 AM  [facility] [address] [place], [place] [address]  Phone [phone] Fax [phone]

## 2016-05-16 ENCOUNTER — Telehealth: Payer: Self-pay | Admitting: Oncology

## 2016-05-16 ENCOUNTER — Encounter: Payer: Self-pay | Admitting: Oncology

## 2016-05-16 NOTE — Telephone Encounter (Signed)
Appt scheduled with Sherrill on 9/12 at 2pm. Pt aware to arrive 30 minutes. Demographics verified. Letter to the referring and mailed to the pt.

## 2016-05-23 ENCOUNTER — Telehealth: Payer: Self-pay | Admitting: Oncology

## 2016-05-23 NOTE — Telephone Encounter (Signed)
Pt cld wanting to cancel appointment and states she will cb to reschedule

## 2016-05-28 ENCOUNTER — Ambulatory Visit: Payer: Medicare Other | Admitting: Oncology

## 2016-06-10 ENCOUNTER — Telehealth: Payer: Self-pay | Admitting: Oncology

## 2016-06-10 ENCOUNTER — Ambulatory Visit (HOSPITAL_BASED_OUTPATIENT_CLINIC_OR_DEPARTMENT_OTHER): Payer: Medicare Other

## 2016-06-10 ENCOUNTER — Ambulatory Visit (HOSPITAL_BASED_OUTPATIENT_CLINIC_OR_DEPARTMENT_OTHER): Payer: Medicare Other | Admitting: Oncology

## 2016-06-10 VITALS — BP 158/78 | HR 68 | Temp 98.1°F | Resp 16 | Ht 64.5 in | Wt 236.8 lb

## 2016-06-10 DIAGNOSIS — M545 Low back pain: Secondary | ICD-10-CM | POA: Diagnosis not present

## 2016-06-10 DIAGNOSIS — R599 Enlarged lymph nodes, unspecified: Secondary | ICD-10-CM | POA: Diagnosis present

## 2016-06-10 DIAGNOSIS — G8929 Other chronic pain: Secondary | ICD-10-CM

## 2016-06-10 DIAGNOSIS — Z801 Family history of malignant neoplasm of trachea, bronchus and lung: Secondary | ICD-10-CM

## 2016-06-10 DIAGNOSIS — R590 Localized enlarged lymph nodes: Secondary | ICD-10-CM

## 2016-06-10 DIAGNOSIS — Z807 Family history of other malignant neoplasms of lymphoid, hematopoietic and related tissues: Secondary | ICD-10-CM

## 2016-06-10 DIAGNOSIS — Z803 Family history of malignant neoplasm of breast: Secondary | ICD-10-CM

## 2016-06-10 DIAGNOSIS — K625 Hemorrhage of anus and rectum: Secondary | ICD-10-CM

## 2016-06-10 DIAGNOSIS — Z809 Family history of malignant neoplasm, unspecified: Secondary | ICD-10-CM

## 2016-06-10 LAB — COMPREHENSIVE METABOLIC PANEL
ALT: 18 U/L (ref 0–55)
ANION GAP: 10 meq/L (ref 3–11)
AST: 15 U/L (ref 5–34)
Albumin: 3.9 g/dL (ref 3.5–5.0)
Alkaline Phosphatase: 75 U/L (ref 40–150)
BILIRUBIN TOTAL: 1.27 mg/dL — AB (ref 0.20–1.20)
BUN: 23.1 mg/dL (ref 7.0–26.0)
CALCIUM: 9.4 mg/dL (ref 8.4–10.4)
CHLORIDE: 107 meq/L (ref 98–109)
CO2: 26 meq/L (ref 22–29)
CREATININE: 0.9 mg/dL (ref 0.6–1.1)
EGFR: 59 mL/min/{1.73_m2} — AB (ref >90)
Glucose: 97 mg/dl (ref 70–140)
Potassium: 4.6 mEq/L (ref 3.5–5.1)
Sodium: 142 mEq/L (ref 136–145)
TOTAL PROTEIN: 7.5 g/dL (ref 6.4–8.3)

## 2016-06-10 LAB — CBC WITH DIFFERENTIAL/PLATELET
BASO%: 0.3 % (ref 0.0–2.0)
BASOS ABS: 0 10*3/uL (ref 0.0–0.1)
EOS ABS: 0.1 10*3/uL (ref 0.0–0.5)
EOS%: 1.3 % (ref 0.0–7.0)
HEMATOCRIT: 43.9 % (ref 34.8–46.6)
HGB: 14.6 g/dL (ref 11.6–15.9)
LYMPH%: 29.5 % (ref 14.0–49.7)
MCH: 27.4 pg (ref 25.1–34.0)
MCHC: 33.3 g/dL (ref 31.5–36.0)
MCV: 82.4 fL (ref 79.5–101.0)
MONO#: 0.8 10*3/uL (ref 0.1–0.9)
MONO%: 7.9 % (ref 0.0–14.0)
NEUT%: 61 % (ref 38.4–76.8)
NEUTROS ABS: 6.2 10*3/uL (ref 1.5–6.5)
PLATELETS: 232 10*3/uL (ref 145–400)
RBC: 5.33 10*6/uL (ref 3.70–5.45)
RDW: 15 % — ABNORMAL HIGH (ref 11.2–14.5)
WBC: 10.2 10*3/uL (ref 3.9–10.3)
lymph#: 3 10*3/uL (ref 0.9–3.3)
nRBC: 0 % (ref 0–0)

## 2016-06-10 LAB — LACTATE DEHYDROGENASE: LDH: 201 U/L (ref 125–245)

## 2016-06-10 NOTE — Telephone Encounter (Signed)
Gave patient avs report and appointments for October. Central radiology scheduling will call with scan

## 2016-06-10 NOTE — Progress Notes (Signed)
[facility] Patient Consult   Referring MD: Twisha Granneman 75 y.o.  05/20/1941    Reason for Referral: Lymphadenopathy   HPI: Angela Frederick is followed by Dr. Saintclair Halsted for chronic back pain. She underwent a right sided decompressive L3-4 lumbar laminectomy and an L4-5 lumbar laminectomy on 11/20/2015. She reports recurrence of low back and left leg radicular symptoms. Dr. Saintclair Halsted obtained a repeat MRI of the lumbar spine 04/27/2016. This revealed progressive breast. Adenopathy compared to an MRI from February 2017. The largest lymph node measured 18 mm compared to 15 mm on the prior exam. Interval right laminectomy at L4-5 with decompression of the canal. She was treated with an epidural steroid injection last week for worsening left S1 radiculopathy.  Her primary physician ordered a CT of the abdomen and pelvis on 05/23/2016. No pathologic dilatation of the small bowel or colon. Retroperitoneal lymphadenopathy is noted with a 21 mm left para-aortic node. Multiple prominent nodes were seen in the left pelvis in the sigmoid mesocolon.  She is referred for evaluation of the lymphadenopathy.  Past Medical History:  Diagnosis Date  . Abnormality of gait   . Arthritis   . Chronic diarrhea   . Chronic low back pain   . 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 GERD (gastroesophageal reflux disease)   . Herniated nucleus pulposus   . Obesity   . G2 P2    . Wears glasses     Past Surgical History:  Procedure Laterality Date  . ABDOMINAL HYSTERECTOMY    . BACK SURGERY  2005  . BREAST SURGERY     lumpectomy and biopsy  . COLONOSCOPY    . [place]  . LUMBAR LAMINECTOMY/DECOMPRESSION MICRODISCECTOMY Right 11/20/2015   Procedure: Laminectomy and Foraminotomy - L4-L5 - L3-L4 - right Lumbar Four-Five Discectomy with Lumbar Three-Four Decompression;  Surgeon: Kary Kos, MD;  Location: [facility];  Service: Neurosurgery;   Laterality: Right;  . ROTATOR CUFF REPAIR  2010   right  . TONSILLECTOMY      Medications: Reviewed  Allergies:  Allergies  Allergen Reactions  . Sulfa Antibiotics Hives    Family history: Her father died of "leukemia ". A sister had non-Hodgkin's lymphoma. A granddaughter has a benign brain tumor. A maternal aunt died of breast cancer. A maternal cousin had lung cancer.  Social History:   She lives in [place]. She does not work outside of the home. She does not use cigarettes. Social alcohol use. No transfusion history. No risk factor for HIV or hepatitis.     ROS:   Positives include: Chronic back pain-improved following an epidural steroid injection September 2017, chronic diarrhea-controlled with Questran, occasional bright red blood per rectum-4 years, intentional weight loss  A complete ROS was otherwise negative.  Physical Exam:  Blood pressure (!) 158/78, pulse 68, temperature 98.1 F (36.7 C), temperature source Oral, resp. rate 16, height 5' 4.5" (1.638 m), weight 236 lb 12.8 oz (107.4 kg), SpO2 97 %.  HEENT: Oropharynx without visible mass, neck without mass Lungs: Clear bilaterally Cardiac: Regular rate and rhythm Abdomen: No hepatosplenomegaly, no mass, nontender  Vascular: No leg edema Lymph nodes: No cervical, supraclavicular, axillary, or inguinal nodes Neurologic: Alert and oriented, the motor exam appears intact in the upper and lower extremities Skin: No rash Musculoskeletal: No spine tenderness   LAB:  CBC  Lab Results  Component Value Date   WBC 10.2 06/10/2016   HGB  14.6 06/10/2016   HCT 43.9 06/10/2016   MCV 82.4 06/10/2016   PLT 232 06/10/2016   NEUTROABS 6.2 06/10/2016     CMP      Component Value Date/Time   NA 142 11/15/2015 1254   K 4.6 11/15/2015 1254   CL 108 11/15/2015 1254   CO2 25 11/15/2015 1254   GLUCOSE 110 (H) 11/15/2015 1254   BUN 16 11/15/2015 1254   CREATININE 0.73 11/15/2015 1254   CALCIUM 9.5 11/15/2015  1254   GFRNONAA >60 11/15/2015 1254   GFRAA >60 11/15/2015 1254   Imaging: As per history of present illness, CT images from 05/23/2016-not available for review today   Assessment/Plan:   1. Retroperitoneal lymphadenopathy noted an MRI of the lumbar spine 04/27/2016  CT abdomen/pelvis 05/23/2016 with retroperitoneal lymphadenopathy and prominent sigmoid colon mesocolon lymph nodes 2. Chronic back pain-status post lumbar decompressive laminectomy surgery 11/20/2015  3.   History of intermittent bright red blood per rectum-she reports last colonoscopy was 10 years ago  4.   Essential tremor  5.   G2 P2   Disposition:   Angela Frederick is followed by Dr. Saintclair Halsted for chronic back pain. She was noted to have retroperitoneal lymphadenopathy on a lumbar MRI 04/27/2016. Retroperitoneal lymphadenopathy and prominent sigmoid mesocolon nodes were noted on a CT abdomen/pelvis 05/23/2016.  The lymphadenopathy could be related to a benign condition, a lymphoproliferative disorder, or metastatic carcinoma.  I discussed the differential diagnosis with Angela Frederick and her husband. There is no other clinical evidence for lymphoma or metastatic carcinoma. The differential diagnosis includes colon cancer given the enlarged sigmoid mesocolon nodes and the report of rectal bleeding.  She will be referred for a PET scan and then return for an office visit. We will recommend a diagnostic biopsy pending the PET scan findings.  Approximately 50 minutes were spent with the patient today. The majority of the time was used for counseling and coordination of care.  Betsy Coder, MD  06/10/2016, 4:16 PM

## 2016-06-11 ENCOUNTER — Telehealth: Payer: Self-pay | Admitting: *Deleted

## 2016-06-11 ENCOUNTER — Other Ambulatory Visit: Payer: Self-pay | Admitting: Oncology

## 2016-06-11 ENCOUNTER — Inpatient Hospital Stay
Admission: RE | Admit: 2016-06-11 | Discharge: 2016-06-11 | Disposition: A | Discharge status: Home / Self Care | Payer: Self-pay | Source: Ambulatory Visit | Attending: Oncology | Admitting: Oncology

## 2016-06-11 DIAGNOSIS — C801 Malignant (primary) neoplasm, unspecified: Secondary | ICD-10-CM

## 2016-06-11 NOTE — Telephone Encounter (Signed)
-----   Message from Ladell Pier, MD sent at 06/10/2016  5:08 PM EDT ----- Please call patient, labs ok, f/u after PET

## 2016-06-11 NOTE — Telephone Encounter (Signed)
Pt informed that labs look OK, follow up after PET as discussed. She voiced understanding.

## 2016-06-24 ENCOUNTER — Ambulatory Visit (HOSPITAL_COMMUNITY)
Admission: RE | Admit: 2016-06-24 | Discharge: 2016-06-24 | Disposition: A | Discharge status: Home / Self Care | Payer: Medicare Other | Source: Ambulatory Visit | Attending: Oncology | Admitting: Oncology

## 2016-06-24 DIAGNOSIS — K5730 Diverticulosis of large intestine without perforation or abscess without bleeding: Secondary | ICD-10-CM | POA: Diagnosis not present

## 2016-06-24 DIAGNOSIS — R590 Localized enlarged lymph nodes: Secondary | ICD-10-CM | POA: Diagnosis present

## 2016-06-24 DIAGNOSIS — I700 Atherosclerosis of aorta: Secondary | ICD-10-CM | POA: Diagnosis not present

## 2016-06-24 LAB — GLUCOSE, CAPILLARY: GLUCOSE-CAPILLARY: 92 mg/dL (ref 65–99)

## 2016-06-24 MED ORDER — FLUDEOXYGLUCOSE F - 18 (FDG) INJECTION
11.8200 | Freq: Once | INTRAVENOUS | Status: AC | PRN
  Administered 2016-06-24: 11.82 via INTRAVENOUS

## 2016-06-26 ENCOUNTER — Telehealth: Payer: Self-pay | Admitting: Oncology

## 2016-06-26 ENCOUNTER — Ambulatory Visit (HOSPITAL_BASED_OUTPATIENT_CLINIC_OR_DEPARTMENT_OTHER): Payer: Medicare Other | Admitting: Oncology

## 2016-06-26 VITALS — BP 124/60 | HR 62 | Temp 98.3°F | Resp 18 | Ht 64.5 in | Wt 236.4 lb

## 2016-06-26 DIAGNOSIS — G8929 Other chronic pain: Secondary | ICD-10-CM

## 2016-06-26 DIAGNOSIS — R590 Localized enlarged lymph nodes: Secondary | ICD-10-CM | POA: Diagnosis present

## 2016-06-26 DIAGNOSIS — R251 Tremor, unspecified: Secondary | ICD-10-CM

## 2016-06-26 DIAGNOSIS — M545 Low back pain: Secondary | ICD-10-CM | POA: Diagnosis not present

## 2016-06-26 NOTE — Progress Notes (Signed)
[facility] OFFICE PROGRESS NOTE   Diagnosis: Retroperitoneal lymphadenopathy  INTERVAL HISTORY:   Angela Frederick returns as scheduled.no new complaint. Stable back pain.  Objective:  Vital signs in last 24 hours:  Blood pressure 124/60, pulse 62, temperature 98.3 F (36.8 C), temperature source Oral, resp. rate 18, height 5' 4.5" (1.638 m), weight 236 lb 6.4 oz (107.2 kg), SpO2 97 %.     Lymphatics: no inguinal or femoral nodes  GI: no mass at the anal verge, anal canal, or rectum   Lab Results:  Lab Results  Component Value Date   WBC 10.2 06/10/2016   HGB 14.6 06/10/2016   HCT 43.9 06/10/2016   MCV 82.4 06/10/2016   PLT 232 06/10/2016   NEUTROABS 6.2 06/10/2016   LDH-201 on 06/10/2016   Imaging:  Nm Pet Image Initial (pi) Skull Base To Thigh  Result Date: 06/24/2016 CLINICAL DATA:  Initial treatment strategy for retroperitoneal lymphadenopathy. EXAM: NUCLEAR MEDICINE PET SKULL BASE TO THIGH TECHNIQUE: 11.8 mCi F-18 FDG was injected intravenously. Full-ring PET imaging was performed from the skull base to thigh after the radiotracer. CT data was obtained and used for attenuation correction and anatomic localization. FASTING BLOOD GLUCOSE:  Value: 92 mg/dl COMPARISON:  CT scan 05/23/2016 FINDINGS: NECK No hypermetabolic lymph nodes in the neck. CHEST No hypermetabolic mediastinal or hilar nodes. No suspicious pulmonary nodules on the CT scan. ABDOMEN/PELVIS As seen on the previous study, there is retroperitoneal lymphadenopathy, most prominent in the left para-aortic region. Index left para-aortic lymph node seen on image 129 series 4 has short axis measurement of 16 mm. SUV max = 4.8. Other less prominent left para-aortic lymphadenopathy has similar uptake measurements. Lymph nodes identified in the mesocolon on the prior study showed low level FDG uptake with SUV max = 3.3 (see lymph nodes on image 158 of series 4). No discrete or focal hypermetabolism  identified in the left colon. There is some uptake in the anus which is normal by CT imaging in this uptake may be physiologic. Diverticulosis Of the left colon again noted. Gallbladder surgically absent. There is abdominal aortic atherosclerosis without aneurysm. SKELETON No focal hypermetabolic activity to suggest skeletal metastasis. IMPRESSION: 1. Left para-aortic and left pelvic lymph nodes show low level hypermetabolism. Neoplasm not excluded and lymphoma or metastatic disease could have this appearance. No primary hypermetabolic lesion identified on today's study. 2. Left colonic diverticulosis. 3. Abdominal aortic atherosclerosis. Electronically Signed   By: Misty Stanley M.D.   On: 06/24/2016 12:32    Medications: I have reviewed the patient's current medications.  Assessment/Plan: 1. Retroperitoneal lymphadenopathy noted an MRI of the lumbar spine 04/27/2016 ? CT abdomen/pelvis 05/23/2016 with retroperitoneal lymphadenopathy and prominent sigmoid colon mesocolon lymph nodes ? PET scan 06/24/2016-low-level hypermetabolism associated with left para-aortic and left pelvic lymph nodes. No primary hypermetabolic lesion identified. 2. Chronic back pain-status post lumbar decompressive laminectomy surgery 11/20/2015  3.   History of intermittent bright red blood per rectum-she reports last colonoscopy was 10 years ago  4.   Essential tremor  5.   G2 P2    Disposition:  She appears well. I reviewed the PET images with Ms. Dolezal and her husband. There are small mildly hypermetabolic retroperitoneal lymph nodes. She does not have symptoms to suggest lymphoma or another malignancy. There was an area of hypermetabolism at the anus, but the rectal exam was unremarkable today.  I will present her case at the GI tumor conference next week. We decided to follow her  with observation. She will return for an office visitin 3 months. We will plan for a restaging CT of the abdomen/pelvis at a  six-month interval to follow-up on the retroperitoneal lymph nodes.  Betsy Coder, MD  06/26/2016  11:09 AM

## 2016-06-26 NOTE — Telephone Encounter (Signed)
Gave patient avs report and appointments for January  °

## 2016-07-17 ENCOUNTER — Telehealth: Payer: Self-pay | Admitting: *Deleted

## 2016-07-17 NOTE — Telephone Encounter (Signed)
Refer to Dr. Ardis Hughs or Dr. Carlean Purl

## 2016-07-17 NOTE — Telephone Encounter (Signed)
Called pt informed her Dr. Benay Spice presented her case in GI conference and it was recommended she proceed with screening colonoscopy. Follow up here in January as scheduled. Pt voiced understanding. She requests referral to whichever gastroenterologist Dr. Benay Spice recommends. She declined to schedule with GI in [employer] (now retired) because they had difficulty advancing scope due to "a kink in there." Will review with MD for referral.

## 2016-07-18 ENCOUNTER — Other Ambulatory Visit: Payer: Self-pay | Admitting: *Deleted

## 2016-07-18 DIAGNOSIS — R590 Localized enlarged lymph nodes: Secondary | ICD-10-CM

## 2016-09-25 ENCOUNTER — Other Ambulatory Visit: Payer: Self-pay | Admitting: Neurology

## 2016-09-26 ENCOUNTER — Ambulatory Visit (HOSPITAL_BASED_OUTPATIENT_CLINIC_OR_DEPARTMENT_OTHER): Payer: Medicare Other | Admitting: Oncology

## 2016-09-26 ENCOUNTER — Telehealth: Payer: Self-pay | Admitting: Oncology

## 2016-09-26 VITALS — BP 127/64 | HR 76 | Temp 97.7°F | Resp 19 | Wt 235.7 lb

## 2016-09-26 DIAGNOSIS — G8929 Other chronic pain: Secondary | ICD-10-CM

## 2016-09-26 DIAGNOSIS — R599 Enlarged lymph nodes, unspecified: Secondary | ICD-10-CM | POA: Diagnosis present

## 2016-09-26 DIAGNOSIS — R590 Localized enlarged lymph nodes: Secondary | ICD-10-CM

## 2016-09-26 DIAGNOSIS — M545 Low back pain: Secondary | ICD-10-CM

## 2016-09-26 NOTE — Telephone Encounter (Signed)
Gave patient avs report and appointments for April. Per 1/11 los lab/ct/fu same day. Lab schedule 4/12 @ 12 pm and f/u @ 3:30 pm. Central radiology will call re scan.

## 2016-09-26 NOTE — Progress Notes (Signed)
  [facility] OFFICE PROGRESS NOTE   Diagnosis: Retroperitoneal lymphadenopathy  INTERVAL HISTORY:   Ms. Beauchesne returns as scheduled. She feels well. Good appetite. No palpable lymph nodes. No new complaint.  Objective:  Vital signs in last 24 hours:  Blood pressure 127/64, pulse 76, temperature 97.7 F (36.5 C), temperature source Oral, resp. rate 19, weight 235 lb 11.2 oz (106.9 kg), SpO2 97 %.    HEENT: Neck without mass Lymphatics: No cervical, supraclavicular, axillary, or inguinal nodes Resp: Lungs clear bilaterally Cardio: Regular rate and rhythm GI: No hepatosplenomegaly, no mass Vascular: No leg edema   Lab Results:  Lab Results  Component Value Date   WBC 10.2 06/10/2016   HGB 14.6 06/10/2016   HCT 43.9 06/10/2016   MCV 82.4 06/10/2016   PLT 232 06/10/2016   NEUTROABS 6.2 06/10/2016    Medications: I have reviewed the patient's current medications.  Assessment/Plan: 1. Retroperitoneal lymphadenopathy noted an MRI of the lumbar spine 04/27/2016 ? CT abdomen/pelvis 05/23/2016 with retroperitoneal lymphadenopathy and prominent sigmoid colon mesocolon lymph nodes ? PET scan 06/24/2016-low-level hypermetabolism associated with left para-aortic and left pelvic lymph nodes. No primary hypermetabolic lesion identified. 2. Chronic back pain-status post lumbar decompressive laminectomy surgery 11/20/2015  3. History of intermittent bright red blood per rectum-she reports last colonoscopy was 10 years ago  4. Essential tremor  5. G2 P2   Disposition:  She appears unchanged. No clinical evidence for development of a lymphoproliferative disorder. She will be scheduled for a repeat CT of the abdomen/pelvis and office visit in 3 months.  15 minutes were spent with the patient today. The majority of the time was used for counseling and coordination of care.  Betsy Coder, MD  09/26/2016  1:07 PM

## 2016-11-14 ENCOUNTER — Ambulatory Visit (INDEPENDENT_AMBULATORY_CARE_PROVIDER_SITE_OTHER): Payer: Medicare Other | Admitting: Adult Health

## 2016-11-14 ENCOUNTER — Encounter (INDEPENDENT_AMBULATORY_CARE_PROVIDER_SITE_OTHER): Payer: Self-pay

## 2016-11-14 ENCOUNTER — Encounter: Payer: Self-pay | Admitting: Adult Health

## 2016-11-14 VITALS — BP 108/70 | HR 76 | Ht 64.5 in | Wt 235.8 lb

## 2016-11-14 DIAGNOSIS — M545 Low back pain: Secondary | ICD-10-CM | POA: Diagnosis not present

## 2016-11-14 DIAGNOSIS — G250 Essential tremor: Secondary | ICD-10-CM | POA: Diagnosis not present

## 2016-11-14 DIAGNOSIS — G8929 Other chronic pain: Secondary | ICD-10-CM | POA: Diagnosis not present

## 2016-11-14 MED ORDER — GABAPENTIN 300 MG PO CAPS: ORAL_CAPSULE | ORAL | 3 refills | Status: DC

## 2016-11-14 NOTE — Progress Notes (Signed)
I have read the note, and I agree with the clinical assessment and plan.  Adaline Trejos KEITH   

## 2016-11-14 NOTE — Progress Notes (Signed)
PATIENT: Angela Frederick DOB: 10-05-1940  REASON FOR VISIT: follow up-essential tremor, chronic low back pain HISTORY FROM: patient  HISTORY OF PRESENT ILLNESS: Angela Frederick is a 76 year old female with a history of benign essential tremor and chronic low back pain. She returns today for follow-up. At the last visit, Dr. Jannifer Franklin increased her gabapentin to 300 mg in the morning and at noon and 600 mg in evening. She reports that she was not aware of this increase therefore she is in [place]. taking the same dosage. She states currently she is taking gabapentin 300 in the morning and 600 at night. She states that the tremor has gotten slightly worse. She states that she notices it with her handwriting. Reports that her mother recently passed away and the tremor was worse during this time. She is able to complete all ADLs independently. She continues to have problems eating with the tremor. Reports that her back pain is under good control. She returns today for an evaluation.   HISTORY 05/14/16:Angela Frederick is a 76 year old left-handed white female with a history of a benign essential tremor. The patient also has chronic low back pain, she is followed by Dr. Saintclair Halsted for this. The patient has had some increase in back pain and she has had a recent MRI of the low back. The patient continues to have some gradual worsening of the tremor affecting both upper extremities. She has difficulty with handwriting and feeding herself. She does have some balance issues, she uses a cane for ambulation, she denies any recent falls. She does not sleep well at night because of the pain, and therefore she has increased drowsiness during the day. The patient may nap while she is reading a book or when she is inactive during the day. The patient returns for an evaluation. She is on gabapentin taking 300 mg in the morning and 600 mg in the evening for the low back pain   REVIEW OF SYSTEMS: Out of a complete 14 system review of  symptoms, the patient complains only of the following symptoms, and all other reviewed systems are negative.  See history of present illness  ALLERGIES: Allergies  Allergen Reactions  . Sulfa Antibiotics Hives    HOME MEDICATIONS: Outpatient Medications Prior to Visit  Medication Sig Dispense Refill  . acetaminophen (TYLENOL) 500 MG tablet Take 500 mg by mouth every 6 (six) hours as needed.    . Calcium Carbonate-Vitamin D 600-400 MG-UNIT tablet Take 1 tablet by mouth daily.    . cholestyramine (QUESTRAN) 4 g packet Take 4 g by mouth daily.    . Cyanocobalamin (B-12) 1000 MCG/ML KIT Inject 1,000 mcg as directed every 30 (thirty) days.    . diphenhydramine-acetaminophen (TYLENOL PM) 25-500 MG TABS tablet Take 1 tablet by mouth at bedtime as needed.    . gabapentin (NEURONTIN) 300 MG capsule TAKE 1 CAPSULE BY MOUTH IN THE MORNING AND MIDDAY AND 2 CAPSULES AT NIGHT 360 capsule 3  . loratadine (CLARITIN) 10 MG tablet Take 10 mg by mouth daily as needed.     . meloxicam (MOBIC) 7.5 MG tablet Take 1 tablet by mouth 2 (two) times daily.     Marland Kitchen omeprazole (PRILOSEC) 40 MG capsule Take 40 mg by mouth daily.    Marland Kitchen oxyCODONE (OXY IR/ROXICODONE) 5 MG immediate release tablet Take 5 mg by mouth every 6 (six) hours as needed for moderate pain or severe pain.      No facility-administered medications prior to visit.  PAST MEDICAL HISTORY: Past Medical History:  Diagnosis Date  . Abnormality of gait   . Arthritis   . Chronic diarrhea   . Chronic low back pain   . 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 GERD (gastroesophageal reflux disease)   . Herniated nucleus pulposus   . Obesity   . PONV (postoperative nausea and vomiting)   . Wears glasses     PAST SURGICAL HISTORY: Past Surgical History:  Procedure Laterality Date  . ABDOMINAL HYSTERECTOMY    . BACK SURGERY  2005  . BREAST SURGERY     lumpectomy and biopsy  . COLONOSCOPY    . [place]  .  LUMBAR LAMINECTOMY/DECOMPRESSION MICRODISCECTOMY Right 11/20/2015   Procedure: Laminectomy and Foraminotomy - L4-L5 - L3-L4 - right Lumbar Four-Five Discectomy with Lumbar Three-Four Decompression;  Surgeon: Kary Kos, MD;  Location: [facility];  Service: Neurosurgery;  Laterality: Right;  . ROTATOR CUFF REPAIR  2010   right  . TONSILLECTOMY      FAMILY HISTORY: Family History  Problem Relation Age of Onset  . Stroke Mother   . Leukemia Father   . Lymphoma Sister   . Stroke Sister   . Dementia Sister   . Aneurysm Sister     Brain  . Tremor Neg Hx     SOCIAL HISTORY: Social History   Social History  . Marital status: Married    Spouse name: N/A  . Number of children: 2  . Years of education: hs   Occupational History  . Retired    Social History Main Topics  . Smoking status: Never Smoker  . Smokeless tobacco: Never Used  . Alcohol use Yes     Comment: occasional  . Drug use: No  . Sexual activity: Not on file   Other Topics Concern  . Not on file   Social History Narrative  . No narrative on file      PHYSICAL EXAM  Vitals:   11/14/16 1039  BP: 108/70  Pulse: 76  Weight: 235 lb 12.8 oz (107 kg)  Height: 5' 4.5" (1.638 m)   Body mass index is 39.85 kg/m.  Generalized: Well developed, in no acute distress   Neurological examination  Mentation: Alert oriented to time, place, history taking. Follows all commands speech and language fluent Cranial nerve II-XII: Pupils were equal round reactive to light. Extraocular movements were full, visual field were full on confrontational test. Facial sensation and strength were normal. Uvula tongue midline. Head turning and shoulder shrug  were normal and symmetric. Mild to moderate Intention tremor noted in the upper extremities. Motor: The motor testing reveals 5 over 5 strength of all 4 extremities. Good symmetric motor tone is noted throughout.  Sensory: Sensory testing is intact to soft touch on all 4  extremities. No evidence of extinction is noted.  Coordination: Cerebellar testing reveals good finger-nose-finger and heel-to-shin bilaterally.  Gait and station: Patient uses a cane when ambulating. Tandem gait not attempted. Reflexes: Deep tendon reflexes are symmetric and normal bilaterally.   DIAGNOSTIC DATA (LABS, IMAGING, TESTING) - I reviewed patient records, labs, notes, testing and imaging myself where available.  Lab Results  Component Value Date   WBC 10.2 06/10/2016   HGB 14.6 06/10/2016   HCT 43.9 06/10/2016   MCV 82.4 06/10/2016   PLT 232 06/10/2016      Component Value Date/Time   NA 142 06/10/2016 1529   K 4.6 06/10/2016 1529   CL  108 11/15/2015 1254   CO2 26 06/10/2016 1529   GLUCOSE 97 06/10/2016 1529   BUN 23.1 06/10/2016 1529   CREATININE 0.9 06/10/2016 1529   CALCIUM 9.4 06/10/2016 1529   PROT 7.5 06/10/2016 1529   ALBUMIN 3.9 06/10/2016 1529   AST 15 06/10/2016 1529   ALT 18 06/10/2016 1529   ALKPHOS 75 06/10/2016 1529   BILITOT 1.27 (H) 06/10/2016 1529   GFRNONAA >60 11/15/2015 1254   GFRAA >60 11/15/2015 1254    Lab Results  Component Value Date   VITAMINB12 254 04/05/2014   Lab Results  Component Value Date   TSH 3.560 04/05/2014      ASSESSMENT AND PLAN 76 y.o. year old female  has a past medical history of Abnormality of gait; Arthritis; Chronic diarrhea; Chronic low back pain; Essential and other specified forms of tremor (04/05/2014); GERD (gastroesophageal reflux disease); Herniated nucleus pulposus; Obesity; PONV (postoperative nausea and vomiting); and Wears glasses. here with:  1. Essential tremor 2. Chronic low back pain  Overall the patient is doing well. She does feel that the tremor has gotten slightly worse. We will increase the gabapentin to 300 mg in the morning and midday and 600 mg in the evening. This should also offer her benefit  with her back pain. Advised that if her symptoms worsen or she develops new symptoms she  should let us know. Also advised that if increasing gabapentin caused too much drowsiness she should let us know. She will follow-up in 6 months or sooner if needed.   Ward Givens, MSN, NP-C 11/14/2016, 10:41 AM [facility] [address], [place], [place] [address] [phone]

## 2016-11-14 NOTE — Patient Instructions (Signed)
Try taking Gabapentin 300 mg in the moon and midday and 600 mg at bedtime If this causes too much drowsiness please let me know If your symptoms worsen or you develop new symptoms please let us know.

## 2016-12-24 ENCOUNTER — Telehealth: Payer: Self-pay | Admitting: *Deleted

## 2016-12-24 NOTE — Telephone Encounter (Signed)
Returned patient's call with questions about CT scans scheduled 12-26-2016.  Answered questions about "difference in PET, MRI and CT scan, why test was ordered after having previous imaging test and does the test cause diarrhea.  I have problems with diarrhea and need to make sure I will have access to a bathroom and no accidents."  Encouraged to take Questran before the time she was instructed to take nothing by mouth.  Also instructed to eat vanilla yogurt and nothing high in fiber the evening before and morning of CT scan.

## 2016-12-26 ENCOUNTER — Other Ambulatory Visit (HOSPITAL_BASED_OUTPATIENT_CLINIC_OR_DEPARTMENT_OTHER): Payer: Medicare Other

## 2016-12-26 ENCOUNTER — Ambulatory Visit (HOSPITAL_BASED_OUTPATIENT_CLINIC_OR_DEPARTMENT_OTHER): Payer: Medicare Other | Admitting: Oncology

## 2016-12-26 ENCOUNTER — Ambulatory Visit (HOSPITAL_COMMUNITY)
Admission: RE | Admit: 2016-12-26 | Discharge: 2016-12-26 | Disposition: A | Discharge status: Home / Self Care | Payer: Medicare Other | Source: Ambulatory Visit | Attending: Oncology | Admitting: Oncology

## 2016-12-26 VITALS — BP 114/62 | HR 69 | Temp 98.0°F | Resp 18 | Ht 64.5 in | Wt 238.3 lb

## 2016-12-26 DIAGNOSIS — I700 Atherosclerosis of aorta: Secondary | ICD-10-CM | POA: Insufficient documentation

## 2016-12-26 DIAGNOSIS — R590 Localized enlarged lymph nodes: Secondary | ICD-10-CM | POA: Diagnosis present

## 2016-12-26 DIAGNOSIS — G8929 Other chronic pain: Secondary | ICD-10-CM | POA: Diagnosis not present

## 2016-12-26 DIAGNOSIS — M545 Low back pain: Secondary | ICD-10-CM

## 2016-12-26 LAB — BASIC METABOLIC PANEL
Anion Gap: 8 mEq/L (ref 3–11)
BUN: 22.8 mg/dL (ref 7.0–26.0)
CALCIUM: 9.5 mg/dL (ref 8.4–10.4)
CO2: 27 meq/L (ref 22–29)
Chloride: 106 mEq/L (ref 98–109)
Creatinine: 0.8 mg/dL (ref 0.6–1.1)
EGFR: 77 mL/min/{1.73_m2} — AB (ref >90)
GLUCOSE: 98 mg/dL (ref 70–140)
POTASSIUM: 4.6 meq/L (ref 3.5–5.1)
Sodium: 142 mEq/L (ref 136–145)

## 2016-12-26 MED ORDER — IOPAMIDOL (ISOVUE-300) INJECTION 61%
100.0000 mL | Freq: Once | INTRAVENOUS | Status: AC | PRN
  Administered 2016-12-26: 100 mL via INTRAVENOUS

## 2016-12-26 MED ORDER — IOPAMIDOL (ISOVUE-300) INJECTION 61%
INTRAVENOUS | Status: AC
  Filled 2016-12-26: qty 100

## 2016-12-26 NOTE — Progress Notes (Signed)
  [facility] OFFICE PROGRESS NOTE   Diagnosis: Retroperitoneal lymphadenopathy  INTERVAL HISTORY:   Ms. Ayllon returns as scheduled. No fever or night sweats. Chronic back pain. No other pain. She developed diarrhea after drinking the CT contrast today. No dyspnea or consistent cough.  Objective:  Vital signs in last 24 hours:  Blood pressure 114/62, pulse 69, temperature 98 F (36.7 C), temperature source Oral, resp. rate 18, height 5' 4.5" (1.638 m), weight 238 lb 4.8 oz (108.1 kg), SpO2 96 %.    HEENT: Neck without mass Lymphatics: No cervical, supraclavicular, axillary, or inguinal nodes Resp: Inspiratory rhonchi at the left greater than right posterior base, no respiratory distress Cardio: Regular rate and rhythm GI: No hepatosplenomegaly, no mass Vascular: No leg edema   Lab Results:  Lab Results  Component Value Date   WBC 10.2 06/10/2016   HGB 14.6 06/10/2016   HCT 43.9 06/10/2016   MCV 82.4 06/10/2016   PLT 232 06/10/2016   NEUTROABS 6.2 06/10/2016     Medications: I have reviewed the patient's current medications.  Assessment/Plan: 1. Retroperitoneal lymphadenopathy noted an MRI of the lumbar spine 04/27/2016 ? CT abdomen/pelvis 05/23/2016 with retroperitoneal lymphadenopathy and prominent sigmoid colon mesocolon lymph nodes ? PET scan 06/24/2016-low-level hypermetabolism associated with left para-aortic and left pelvic lymph nodes. No primary hypermetabolic lesion identified. 2. Chronic back pain-status post lumbar decompressive laminectomy surgery 11/20/2015  3. History of intermittent bright red blood per rectum-she reports last colonoscopy was 10 years ago  4. Essential tremor  5. G2 P2    Disposition:  She appears unchanged. There is no clinical evidence for development of a lymphoproliferative disorder. I reviewed the CT images from today with Ms. Bachtell and her husband. The retroperitoneal lymph nodes do not appear  significantly changed compared to the CT from September 2017. We will contact her when the final report is available. The plan is to continue observation. She will return for an office visit in 6 months.    Betsy Coder, MD  12/26/2016  4:25 PM

## 2016-12-27 ENCOUNTER — Telehealth: Payer: Self-pay | Admitting: *Deleted

## 2016-12-27 NOTE — Telephone Encounter (Signed)
-----   Message from Angela Pier, MD sent at 12/26/2016  8:34 PM EDT ----- Please call patient, CT shows stable lymph nodes, f/u as scheduled

## 2016-12-27 NOTE — Telephone Encounter (Signed)
Called pt with CT results. Stable, per MD. Pt understands to follow up in 6 months as scheduled. She stated she does not want another CT.

## 2017-02-24 ENCOUNTER — Telehealth: Payer: Self-pay | Admitting: Adult Health

## 2017-02-24 NOTE — Telephone Encounter (Signed)
Patients husband called office in reference to patient being dizzy, balance is terrible, had a nagging headache symptoms have been present since Friday.  Please call husband would like patient to be seen.  If patient can not be reached on his mobile number please contact [phone]

## 2017-02-24 NOTE — Telephone Encounter (Signed)
It appears that Angela Blossom, NP has already spoken to this pt today. See other telephone note.

## 2017-02-24 NOTE — Telephone Encounter (Signed)
I called the patient. Spoke to the patient and her husband. They state that starting on Friday evening she began to have some dizziness She states Saturday morning she could "hardly walk" when she woke up. They report that this was due to dizziness. She denies the room spinning but describes the dizziness as "feeling drunk." She states that she is also had a nagging headache since Friday. Denies weakness in extremities and denies any sensory changes. States that the headache has never been severe. She is concerned that she may have had a stroke. I instructed the patient to go to the emergency room however she refused. I did explain the importance of being evaluated immeditely especially if she felt that she had had a stoke. She voiced understanding the states that she would rather have an appointment with our office. Dr. Jannifer Franklin has an office visit open on Wednesday.  She has an appointment scheduled June 13 at 9:00 with Dr. Jannifer Franklin. I again reiterated the importance of her going to the emergency room sine this was a sudden change in her symptoms,patient voiced understanding.

## 2017-02-24 NOTE — Telephone Encounter (Signed)
Noted  

## 2017-02-26 ENCOUNTER — Ambulatory Visit: Payer: Medicare Other | Admitting: Neurology

## 2017-05-20 ENCOUNTER — Ambulatory Visit: Payer: Medicare Other | Admitting: Adult Health

## 2017-06-10 ENCOUNTER — Ambulatory Visit (INDEPENDENT_AMBULATORY_CARE_PROVIDER_SITE_OTHER): Payer: Medicare Other | Admitting: Adult Health

## 2017-06-10 ENCOUNTER — Encounter: Payer: Self-pay | Admitting: Adult Health

## 2017-06-10 VITALS — BP 138/86 | HR 72 | Wt 239.2 lb

## 2017-06-10 DIAGNOSIS — R269 Unspecified abnormalities of gait and mobility: Secondary | ICD-10-CM

## 2017-06-10 DIAGNOSIS — G250 Essential tremor: Secondary | ICD-10-CM | POA: Diagnosis not present

## 2017-06-10 MED ORDER — GABAPENTIN 300 MG PO CAPS: ORAL_CAPSULE | ORAL | 3 refills | Status: DC

## 2017-06-10 NOTE — Progress Notes (Signed)
I have read the note, and I agree with the clinical assessment and plan.  Thomasa Heidler KEITH   

## 2017-06-10 NOTE — Progress Notes (Signed)
PATIENT: Angela Frederick DOB: 1940/10/22  REASON FOR VISIT: follow up- benign essential tremor HISTORY FROM: patient  HISTORY OF PRESENT ILLNESS: Today 06/10/17 Ms. Angela Frederick is a 76 year old female with a history of benign essential tremor, chronic low back pain abnormality of gait. She returns today for follow-up.  reports that she feels that she has gotten worse. States that she is now having to use a spoon when eating due to the tremor. He reports that he feels her balance is worse however the patient doesn't necessarily feel that way. She's had 2 falls recently. He states that the worse [place] beach. She does do water aerobics weekly. She denies any dizzy episodes. She continues on gabapentin 300 mg in the morning 600 mg at night. She states that she never increased the gabapentin as she cannot tolerate the drowsiness. She uses a cane when ambulating. She returns today for an evaluation.  HISTORY Angela Frederick is a 76 year old female with a history of benign essential tremor and chronic low back pain. She returns today for follow-up. At the last visit, Dr. Jannifer Franklin increased her gabapentin to 300 mg in the morning and at noon and 600 mg in evening. She reports that she was not aware of this increase therefore she is in [place]. taking the same dosage. She states currently she is taking gabapentin 300 in the morning and 600 at night. She states that the tremor has gotten slightly worse. She states that she notices it with her handwriting. Reports that her mother recently passed away and the tremor was worse during this time. She is able to complete all ADLs independently. She continues to have problems eating with the tremor. Reports that her back pain is under good control. She returns today for an evaluation.   REVIEW OF SYSTEMS: Out of a complete 14 system review of symptoms, the patient complains only of the following symptoms, and all other reviewed systems are negative.  Frequency of  urination, tremors, walking difficulty, neck pain, daytime sleepiness, decreased concentration, eye pain, blurred vision, diarrhea, eye discharge  ALLERGIES: Allergies  Allergen Reactions  . Sulfa Antibiotics Hives    HOME MEDICATIONS: Outpatient Medications Prior to Visit  Medication Sig Dispense Refill  . acetaminophen (TYLENOL) 500 MG tablet Take 500 mg by mouth every 6 (six) hours as needed.    . Calcium Carbonate-Vitamin D 600-400 MG-UNIT tablet Take 1 tablet by mouth daily.    . cholestyramine (QUESTRAN) 4 g packet Take 4 g by mouth daily.    . Cyanocobalamin (B-12) 1000 MCG/ML KIT Inject 1,000 mcg as directed every 30 (thirty) days.    . diphenhydramine-acetaminophen (TYLENOL PM) 25-500 MG TABS tablet Take 1 tablet by mouth at bedtime as needed.    . gabapentin (NEURONTIN) 300 MG capsule TAKE 1 CAPSULE BY MOUTH IN THE MORNING AND MIDDAY AND 2 CAPSULES AT NIGHT 360 capsule 3  . loratadine (CLARITIN) 10 MG tablet Take 10 mg by mouth daily as needed.     . meloxicam (MOBIC) 7.5 MG tablet Take 1 tablet by mouth 2 (two) times daily.     Marland Kitchen omeprazole (PRILOSEC) 40 MG capsule Take 40 mg by mouth daily.    Marland Kitchen oxyCODONE (OXY IR/ROXICODONE) 5 MG immediate release tablet Take 5 mg by mouth every 6 (six) hours as needed for moderate pain or severe pain.      No facility-administered medications prior to visit.     PAST MEDICAL HISTORY: Past Medical History:  Diagnosis Date  .  Abnormality of gait   . Arthritis   . Chronic diarrhea   . Chronic low back pain   . 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 GERD (gastroesophageal reflux disease)   . Herniated nucleus pulposus   . Obesity   . PONV (postoperative nausea and vomiting)   . Wears glasses     PAST SURGICAL HISTORY: Past Surgical History:  Procedure Laterality Date  . ABDOMINAL HYSTERECTOMY    . BACK SURGERY  2005  . BREAST SURGERY     lumpectomy and biopsy  . COLONOSCOPY    . [place]    . LUMBAR LAMINECTOMY/DECOMPRESSION MICRODISCECTOMY Right 11/20/2015   Procedure: Laminectomy and Foraminotomy - L4-L5 - L3-L4 - right Lumbar Four-Five Discectomy with Lumbar Three-Four Decompression;  Surgeon: Kary Kos, MD;  Location: [facility];  Service: Neurosurgery;  Laterality: Right;  . ROTATOR CUFF REPAIR  2010   right  . TONSILLECTOMY      FAMILY HISTORY: Family History  Problem Relation Age of Onset  . Stroke Mother   . Leukemia Father   . Lymphoma Sister   . Stroke Sister   . Dementia Sister   . Tremor Neg Hx     SOCIAL HISTORY: Social History   Social History  . Marital status: Married    Spouse name: Fritz Pickerel  . Number of children: 2  . Years of education: hs   Occupational History  . Retired    Social History Main Topics  . Smoking status: Never Smoker  . Smokeless tobacco: Never Used  . Alcohol use Yes     Comment: occasional  . Drug use: No  . Sexual activity: Not on file   Other Topics Concern  . Not on file   Social History Narrative   Lives with husband      PHYSICAL EXAM  Vitals:   06/10/17 1054  BP: 138/86  Pulse: 72  Weight: 239 lb 3.2 oz (108.5 kg)   Body mass index is 40.42 kg/m.   MMSE - Mini Mental State Exam 05/11/2015  Not completed: (No Data)  Orientation to time 4  Orientation to Place 5  Registration 3  Attention/ Calculation 4  Recall 0  Language- name 2 objects 2  Language- repeat 1  Language- follow 3 step command 3  Language- read & follow direction 1  Write a sentence 1  Copy design 1  Total score 25     Generalized: Well developed, in no acute distress   Neurological examination  Mentation: Alert oriented to time, place, history taking. Follows all commands speech and language fluent Cranial nerve II-XII: Pupils were equal round reactive to light. Extraocular movements were full, visual field were full on confrontational test. Facial sensation and strength were normal. Uvula tongue midline. Head turning  and shoulder shrug  were normal and symmetric. Motor: The motor testing reveals 5 over 5 strength of all 4 extremities. Good symmetric motor tone is noted throughout.  Sensory: Sensory testing is intact to soft touch on all 4 extremities. No evidence of extinction is noted.  Coordination: Cerebellar testing reveals good finger-nose-finger and heel-to-shin bilaterally. Mild tremor in the hands bilaterally Gait and station: Patient uses a cane when ambulating. Tandem gait not attempted. Reflexes: Deep tendon reflexes are symmetric and normal bilaterally.   DIAGNOSTIC DATA (LABS, IMAGING, TESTING) - I reviewed patient records, labs, notes, testing and imaging myself where available.  Lab Results  Component Value Date   WBC 10.2 06/10/2016  HGB 14.6 06/10/2016   HCT 43.9 06/10/2016   MCV 82.4 06/10/2016   PLT 232 06/10/2016      Component Value Date/Time   NA 142 12/26/2016 1228   K 4.6 12/26/2016 1228   CL 108 11/15/2015 1254   CO2 27 12/26/2016 1228   GLUCOSE 98 12/26/2016 1228   BUN 22.8 12/26/2016 1228   CREATININE 0.8 12/26/2016 1228   CALCIUM 9.5 12/26/2016 1228   PROT 7.5 06/10/2016 1529   ALBUMIN 3.9 06/10/2016 1529   AST 15 06/10/2016 1529   ALT 18 06/10/2016 1529   ALKPHOS 75 06/10/2016 1529   BILITOT 1.27 (H) 06/10/2016 1529   GFRNONAA >60 11/15/2015 1254   GFRAA >60 11/15/2015 1254    Lab Results  Component Value Date   VITAMINB12 254 04/05/2014   Lab Results  Component Value Date   TSH 3.560 04/05/2014      ASSESSMENT AND PLAN 76 y.o. year old female  has a past medical history of Abnormality of gait; Arthritis; Chronic diarrhea; Chronic low back pain; Essential and other specified forms of tremor (04/05/2014); GERD (gastroesophageal reflux disease); Herniated nucleus pulposus; Obesity; PONV (postoperative nausea and vomiting); and Wears glasses. here with:  1. Essential tremor 2. Abnormality of gait  The patient's tremor on exam is relatively mild. I  advised patient that she should continue on gabapentin for now. She will continue 300 mg in the morning and 600 mg at night. She could potentially even decrease her dose by eliminating one tablet at night to see if this offers any changes in her balance. Patient voiced understanding. Also advised patient that she can try weighted utensils or wrist weights to help with her tremor when eating. She voiced understanding. Advised that we will also send her to physical therapy for gait and balance training. She voiced understanding. She will follow-up in 6 months or sooner if needed.    Ward Givens, MSN, NP-C 06/10/2017, 11:06 AM [facility] [address] [place], [place] [address] [phone]

## 2017-06-10 NOTE — Patient Instructions (Signed)
Your Plan:  Continue gabapentin - can try reducing dose to 300 mg twice a day Physical therapy for gait and balance If your symptoms worsen or you develop new symptoms please let us know.   Thank you for coming to see [place] at [facility]. I hope we have been able to provide you high quality care today.  You may receive a patient satisfaction survey over the next few weeks. We would appreciate your feedback and comments so that we may continue to improve ourselves and the health of our patients.

## 2017-06-11 ENCOUNTER — Telehealth: Payer: Self-pay | Admitting: Adult Health

## 2017-06-11 NOTE — Telephone Encounter (Signed)
Spoke to Patient - Patient wants to go to [facility] fax [phone] . PT order.

## 2017-06-27 ENCOUNTER — Telehealth: Payer: Self-pay

## 2017-06-27 ENCOUNTER — Ambulatory Visit (HOSPITAL_BASED_OUTPATIENT_CLINIC_OR_DEPARTMENT_OTHER): Payer: Medicare Other | Admitting: Oncology

## 2017-06-27 ENCOUNTER — Other Ambulatory Visit (HOSPITAL_BASED_OUTPATIENT_CLINIC_OR_DEPARTMENT_OTHER): Payer: Medicare Other

## 2017-06-27 VITALS — BP 128/57 | HR 67 | Temp 98.7°F | Resp 18 | Ht 64.0 in | Wt 233.9 lb

## 2017-06-27 DIAGNOSIS — R599 Enlarged lymph nodes, unspecified: Secondary | ICD-10-CM

## 2017-06-27 DIAGNOSIS — R590 Localized enlarged lymph nodes: Secondary | ICD-10-CM

## 2017-06-27 DIAGNOSIS — G8929 Other chronic pain: Secondary | ICD-10-CM

## 2017-06-27 DIAGNOSIS — M542 Cervicalgia: Secondary | ICD-10-CM

## 2017-06-27 DIAGNOSIS — M545 Low back pain: Secondary | ICD-10-CM

## 2017-06-27 LAB — CBC WITH DIFFERENTIAL/PLATELET
BASO%: 0.7 % (ref 0.0–2.0)
BASOS ABS: 0.1 10*3/uL (ref 0.0–0.1)
EOS ABS: 0.2 10*3/uL (ref 0.0–0.5)
EOS%: 1.9 % (ref 0.0–7.0)
HCT: 43.6 % (ref 34.8–46.6)
HEMOGLOBIN: 14.4 g/dL (ref 11.6–15.9)
LYMPH%: 35.1 % (ref 14.0–49.7)
MCH: 27.4 pg (ref 25.1–34.0)
MCHC: 33.1 g/dL (ref 31.5–36.0)
MCV: 82.8 fL (ref 79.5–101.0)
MONO#: 0.6 10*3/uL (ref 0.1–0.9)
MONO%: 7.4 % (ref 0.0–14.0)
NEUT%: 54.9 % (ref 38.4–76.8)
NEUTROS ABS: 4.5 10*3/uL (ref 1.5–6.5)
PLATELETS: 199 10*3/uL (ref 145–400)
RBC: 5.26 10*6/uL (ref 3.70–5.45)
RDW: 14.4 % (ref 11.2–14.5)
WBC: 8.2 10*3/uL (ref 3.9–10.3)
lymph#: 2.9 10*3/uL (ref 0.9–3.3)

## 2017-06-27 LAB — LACTATE DEHYDROGENASE: LDH: 200 U/L (ref 125–245)

## 2017-06-27 NOTE — Telephone Encounter (Signed)
Printed avs and calender for upcoming appointment 12/29/2017. Per 10/12 los

## 2017-06-27 NOTE — Progress Notes (Signed)
  [facility] OFFICE PROGRESS NOTE   Diagnosis: lymphadenopathy  INTERVAL HISTORY:   Angela Frederick returns as scheduled. She reports her chronic back pain has improved, but she now has neck pain radiating into the left arm. She plans to see Dr. Weston Settle. Good appetite. No palpable lymph nodes.  Objective:  Vital signs in last 24 hours:  Blood pressure (!) 128/57, pulse 67, temperature 98.7 F (37.1 C), temperature source Oral, resp. rate 18, height 5\' 4"  (1.626 m), weight 233 lb 14.4 oz (106.1 kg), SpO2 96 %.    HEENT: neck without mass, tenderness over the lower posterior chest bilaterally Lymphatics: no cervical, supraclavicular, axillary, or inguinal nodes Resp: lungs clear bilaterally Cardio: regular rate and rhythm GI: no hepatosplenomegaly, nontender Vascular: no leg edema Neuro:the motor exam is intact in the upper extremities bilaterally    Lab Results:  Lab Results  Component Value Date   WBC 8.2 06/27/2017   HGB 14.4 06/27/2017   HCT 43.6 06/27/2017   MCV 82.8 06/27/2017   PLT 199 06/27/2017   NEUTROABS 4.5 06/27/2017     Medications: I have reviewed the patient's current medications.  Assessment/Plan: 1. Retroperitoneal lymphadenopathy noted an MRI of the lumbar spine 04/27/2016 ? CT abdomen/pelvis 05/23/2016 with retroperitoneal lymphadenopathy and prominent sigmoid colon mesocolon lymph nodes ? PET scan 06/24/2016-low-level hypermetabolism associated with left para-aortic and left pelvic lymph nodes. No primary hypermetabolic lesion identified. ? CT abdomen/pelvis 12/26/2016-no change in abdominopelvic adenopathy 2. Chronic back pain-status post lumbar decompressive laminectomy surgery 11/20/2015  3. History of intermittent bright red blood per rectum-she reports last colonoscopy was 10 years ago  4. Essential tremor  5. G2 P2   Disposition:  Angela Frederick does not have symptoms to suggest progression of a lymphoproliferative  disorder.She is comfortable with continued observation. She will return for an office visit in 6 months. We will discuss the indication for repeat CT scans when she returns in 6 months.  She plans to follow-up with Dr. Weston Settle for evaluation of the neck pain.  15 minutes were spent with the patient today. The majority of the time was used for counseling and coordination of care. Donneta Romberg, MD  06/27/2017  2:15 PM

## 2017-07-21 ENCOUNTER — Other Ambulatory Visit: Payer: Self-pay | Admitting: Neurology

## 2017-09-08 IMAGING — DX DG SCOLIOSIS EVAL COMPLETE SPINE 2-3V
1 series · 1 of 1 positions shown · non-contrast
Comparison: No prior scoliosis series. Lumbar spine MRI 08/23/2014,
08/08/2010.

CLINICAL DATA: 74-year-old with scoliosis and prior lumbar surgery
for spinal stenosis.

EXAM:
DG SCOLIOSIS EVAL COMPLETE SPINE 2-3V

[l-spine lat]
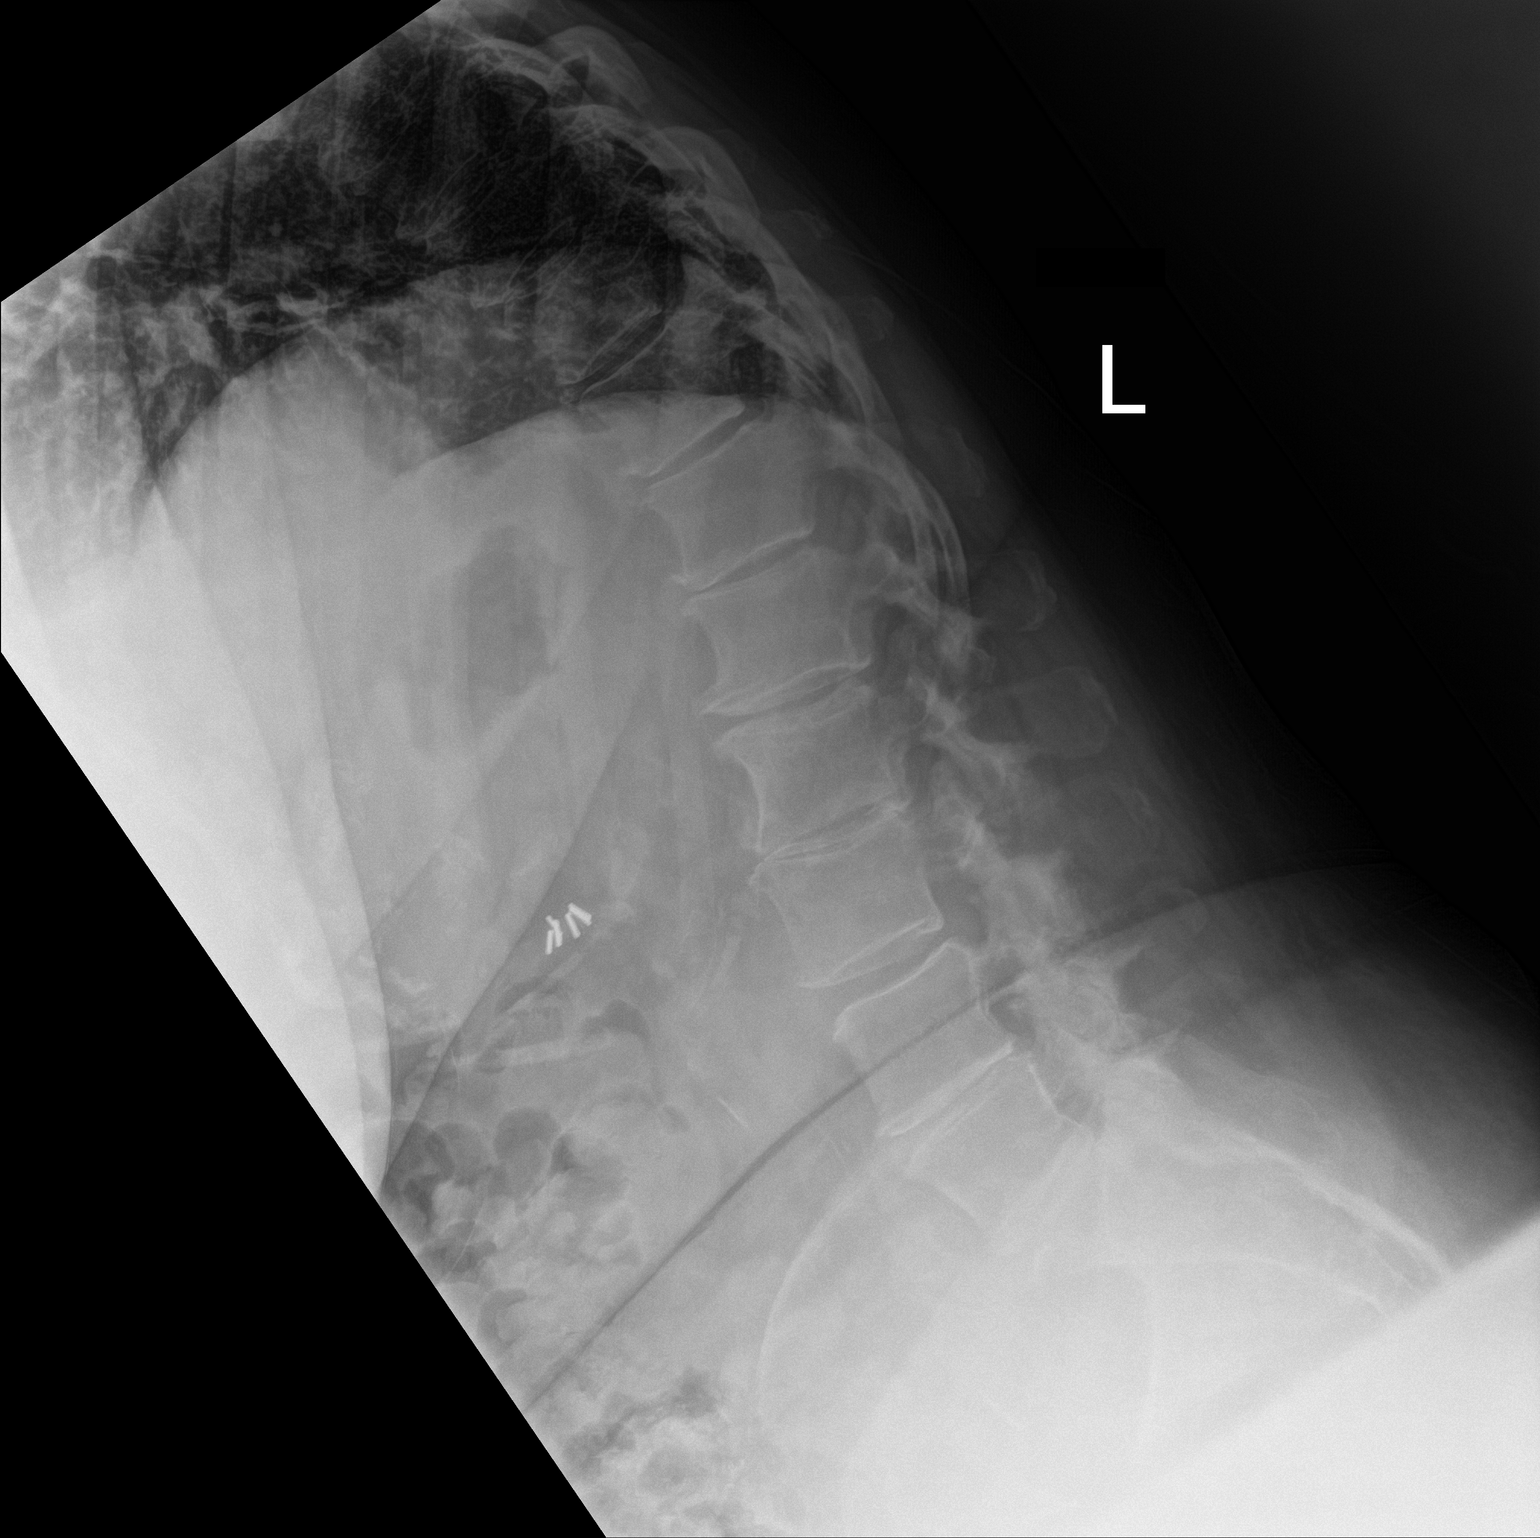

[1 of 1 positions shown; findings below may reference images not displayed]

FINDINGS: There is an upper thoracic scoliosis convex left which measures
approximately 20 degrees when measured from the upper endplate of T1
through the upper endplate of T6. There is a lower thoracic
scoliosis convex left measuring approximately 14 degrees when
measured from the upper endplate of T6 through the upper endplate of
T12. There is a slight lumbar scoliosis convex left measuring
approximately 6 degrees when measured from the upper endplate T12
through the upper endplate of L5.

The lateral image demonstrates multilevel degenerative disc disease
and spondylosis throughout the thoracic and lumbar spine. The
thoracic kyphosis and lumbar lordosis are well preserved and there
is normal anatomic posterior alignment.
IMPRESSION: Thoracic and lumbar scoliosis as detailed above.

## 2017-09-08 IMAGING — MR MR LUMBAR SPINE W/O CM
4 of 5 series · 23 of 48 positions shown · non-contrast
Comparison: MRI lumbar spine 08/23/2014. MRI of the lumbar spine
08/08/2010.

CLINICAL DATA: Lumbar disc displacement. Pain extend to the right
hip and lower extremity. Symptoms over the last week. Pain with
activity.

EXAM:
MRI LUMBAR SPINE WITHOUT CONTRAST
TECHNIQUE: Multiplanar, multisequence MR imaging of the lumbar spine was
performed. No intravenous contrast was administered.

[Series 2: T1 · sagittal · 4.0mm · 0.51mm/px · 5 of 15 slices shown (1 of 2)]
[im 1/15]
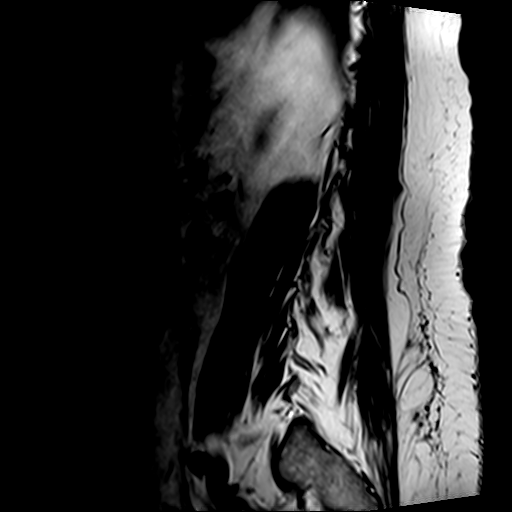
[im 3/15]
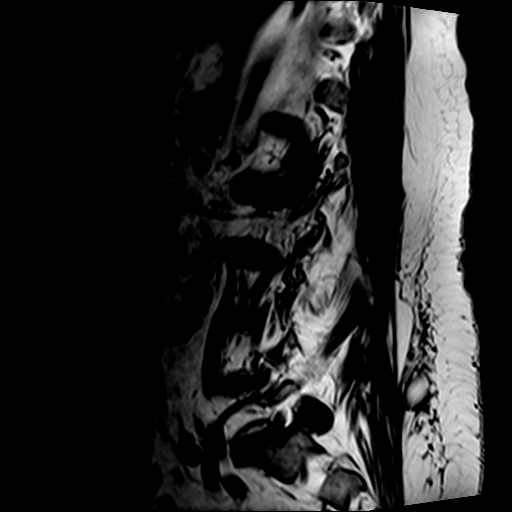
[im 6/15]
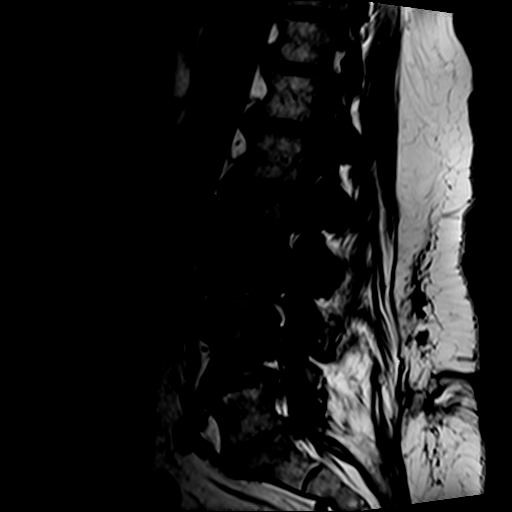
[im 9/15]
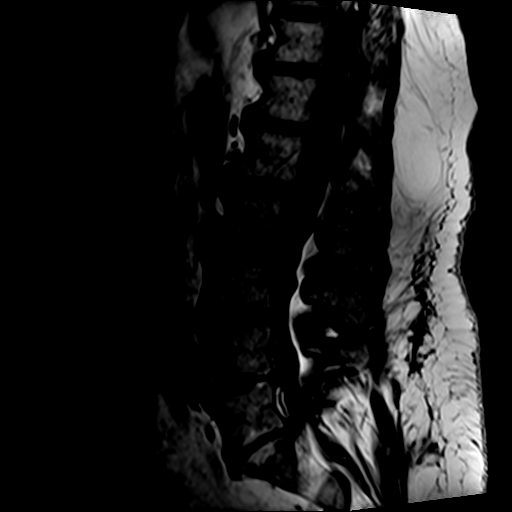
[im 15/15]
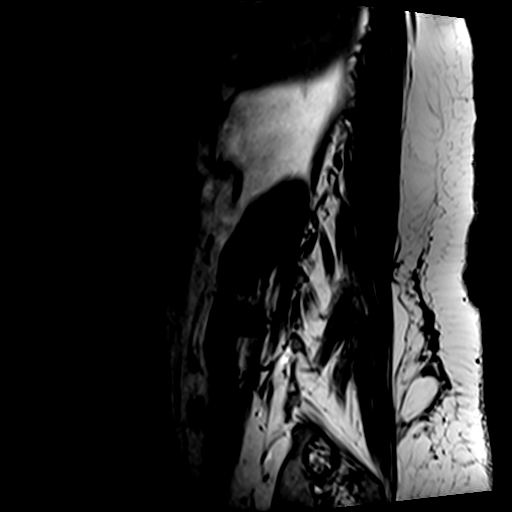

[Series 3: T2 · sagittal · 4.0mm · 0.81mm/px · 6 of 15 slices shown (1 of 2)]
[im 1/15]
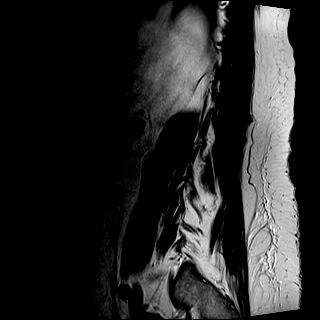
[im 3/15]
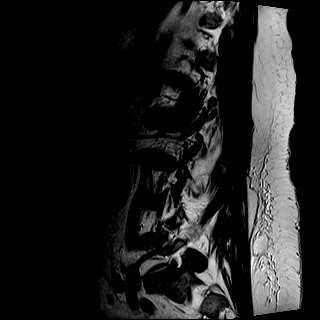
[im 6/15]
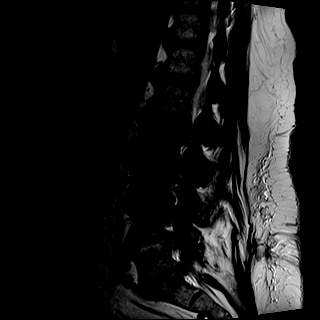
[im 9/15]
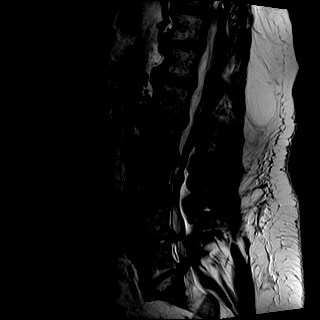
[im 12/15]
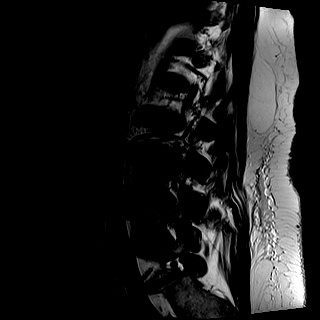
[im 15/15]
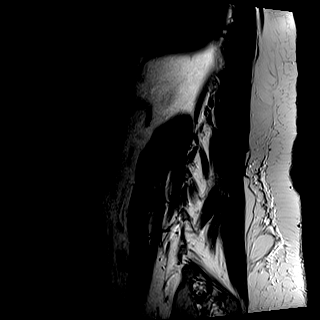

[Series 5: T2 · axial · 4.0mm · 0.43mm/px · z∈[-100,+111]mm · 9 of 40 slices shown (2 of 2)]
[im 1/40]
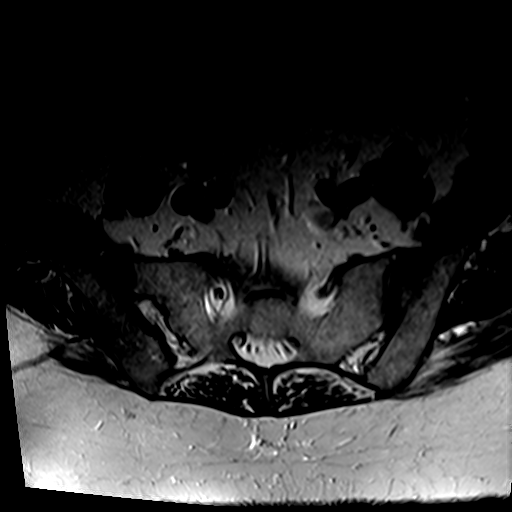
[im 6/40]
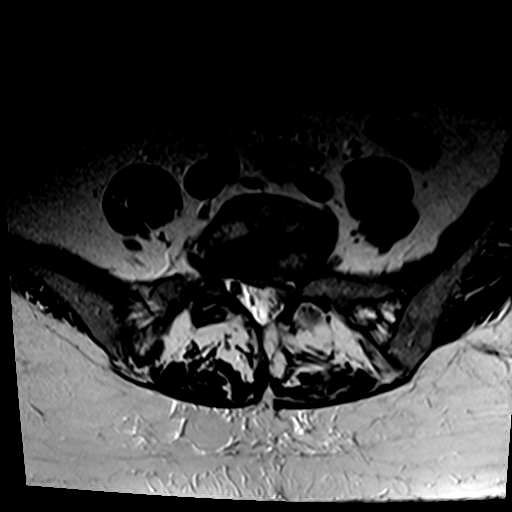
[im 12/40]
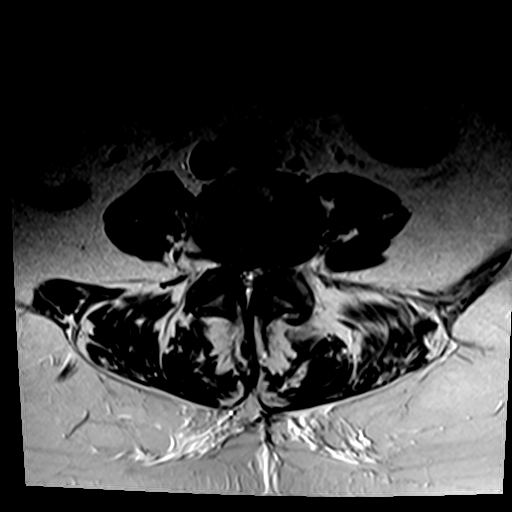
[im 17/40]
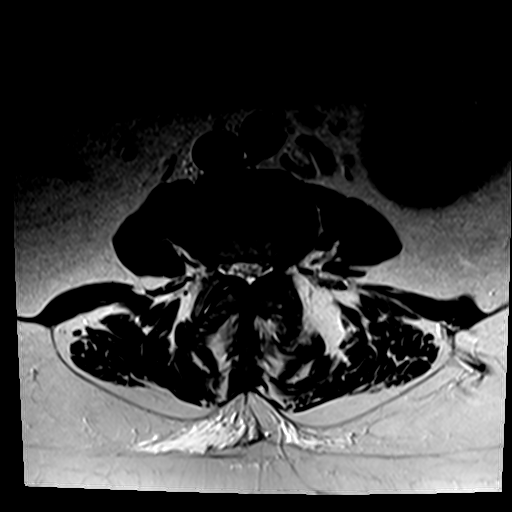
[im 20/40]
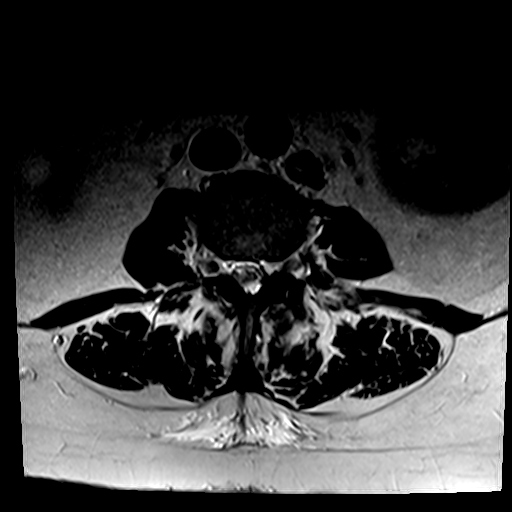
[im 23/40]
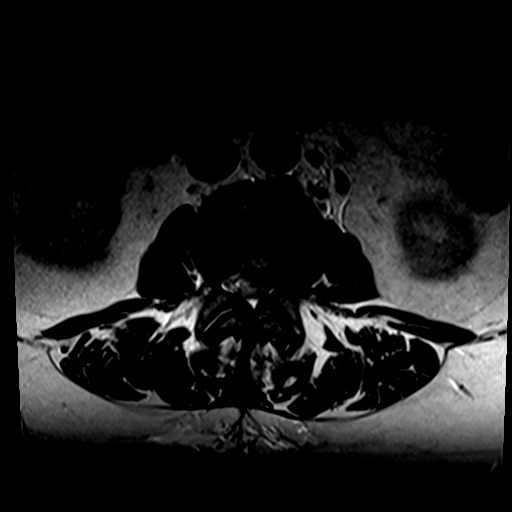
[im 28/40]
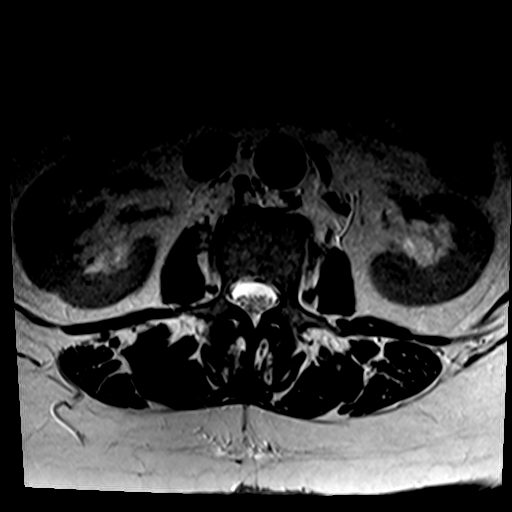
[im 34/40]
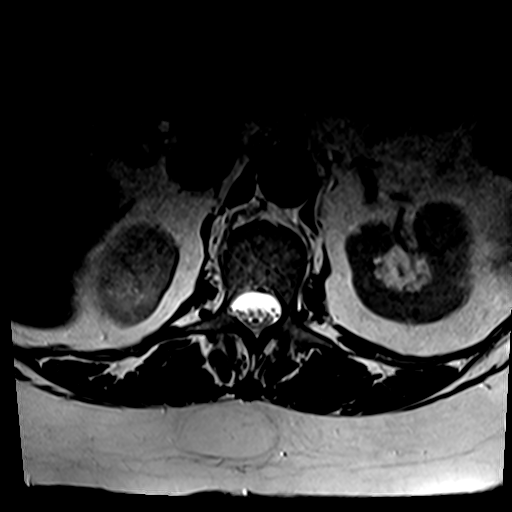
[im 40/40]
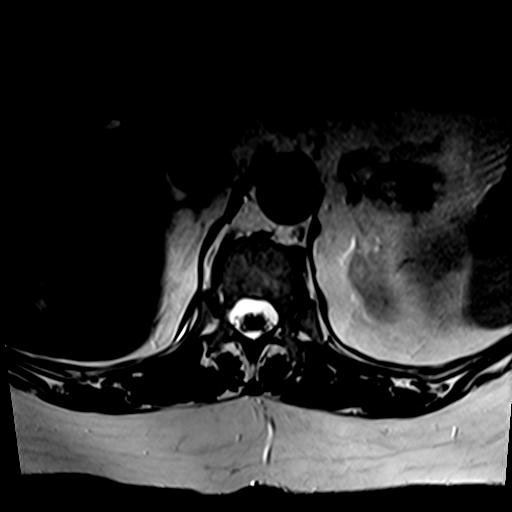

[Series 6: T1 · axial · 4.0mm · 0.86mm/px · z∈[-75,+81]mm · 3 of 40 slices shown (2 of 2)]
[im 6/40]
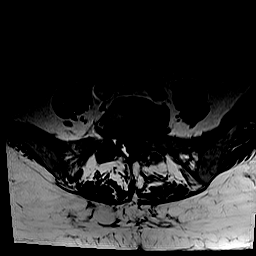
[im 20/40]
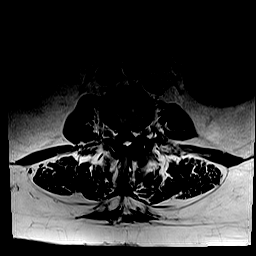
[im 34/40]
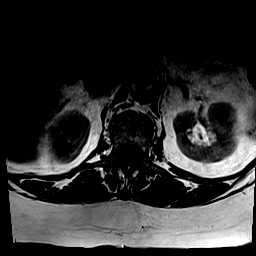

[23 of 48 positions shown; findings below may reference images not displayed]

FINDINGS: Normal signal is present in the conus medullaris which terminates at
L1. Rightward curvature of the lumbar spine is again noted at L4.
Chronic endplate marrow changes are present on the right at L5-S1
and on the left L4-5. Chronic endplate marrow changes are also noted
L2-3. There is further loss of disc height at L2-3 and slight retro
listhesis.

A benign exophytic cyst is present at the lower pole of the left
kidney. Left para-aortic lymph nodes have increased in size since
the prior study. The largest node now measures 15 mm in short axis.
No other focal lesions are present.

T12-L1: Mild facet hypertrophy is again noted. There is no
significant stenosis.

L1-2: Mild facet hypertrophy and disc bulging is present bilaterally
without significant stenosis or change.

L2-3: A progressive broad-based disc protrusion is present. There is
now moderate subarticular stenosis bilaterally. Mild to moderate
foraminal stenosis bilaterally has progressed as well, right greater
than left.

L3-4: A broad-based disc protrusion has progressed some. There is
progressive moderate facet hypertrophy is well. This results in
moderate left and mild right subarticular stenosis. Mild to moderate
foraminal stenosis is worse on the left.

L4-5: A right paramedian disc protrusion is similar the prior exam.
Moderate facet hypertrophy is again noted. Moderate subarticular
stenosis is stable, worse on the right. Mild foraminal narrowing is
evident bilaterally.

L5-S1: A left laminectomy is noted. A leftward disc protrusion is
stable. Mild foraminal narrowing is unchanged.
IMPRESSION: 1. Progressive broad-based disc protrusion and facet disease at L2-3
with now moderate subarticular stenosis bilaterally.
2. Mild to moderate foraminal stenosis at L2-3 has also progressed,
worse on the right.
3. Progressive facet hypertrophy with moderate left and mild right
subarticular narrowing and mild to moderate foraminal narrowing
bilaterally at L3-4.
4. Stable moderate central canal stenosis and mild foraminal
narrowing bilaterally at L4-5.
5. Stable postoperative changes and mild foraminal stenosis at
L5-S1.
6. Increasing size of left para-aortic lymph nodes. These are
nonspecific in likely reactive. The nodes have been present since at
least 1522, though somewhat smaller.

## 2017-12-08 ENCOUNTER — Ambulatory Visit (INDEPENDENT_AMBULATORY_CARE_PROVIDER_SITE_OTHER): Payer: Medicare Other | Admitting: Adult Health

## 2017-12-08 ENCOUNTER — Encounter: Payer: Self-pay | Admitting: Adult Health

## 2017-12-08 VITALS — BP 137/85 | HR 68 | Wt 242.0 lb

## 2017-12-08 DIAGNOSIS — G250 Essential tremor: Secondary | ICD-10-CM | POA: Diagnosis not present

## 2017-12-08 DIAGNOSIS — R269 Unspecified abnormalities of gait and mobility: Secondary | ICD-10-CM | POA: Diagnosis not present

## 2017-12-08 MED ORDER — GABAPENTIN 300 MG PO CAPS: 300.0000 mg | ORAL_CAPSULE | Freq: Two times a day (BID) | ORAL | 1 refills | Status: DC

## 2017-12-08 NOTE — Progress Notes (Signed)
I have read the note, and I agree with the clinical assessment and plan.  Angela Frederick   

## 2017-12-08 NOTE — Patient Instructions (Signed)
Your Plan:  Decrease gabapentin to 300 mg twice a day. Do for two weeks and call for reduce dose. If your symptoms worsen or you develop new symptoms please let us know.   Thank you for coming to see [place] at [facility]. I hope we have been able to provide you high quality care today.  You may receive a patient satisfaction survey over the next few weeks. We would appreciate your feedback and comments so that we may continue to improve ourselves and the health of our patients.

## 2017-12-08 NOTE — Progress Notes (Signed)
  PATIENT: Angela Frederick DOB: 07/19/1941  REASON FOR VISIT: follow up HISTORY FROM: patient  HISTORY OF PRESENT ILLNESS: Today 12/08/17 Ms. Secrest is a 77-year-old female with a history of essential tremor, chronic low back pain and abnormality of gait.  She returns today for follow-up.  The patient feels that her tremor has gotten worse.  It primarily affects her hands.  She states that she is no longer able to write because it is not legible.  She states that she cannot handle a fork when eating and only uses a spoon.  She continues to have trouble with her balance.  She has not had any significant falls since the last visit but does report that her balance is unsteady.  She did participate in physical therapy but was not able to complete the entire course.  She states that she does continue to do the exercises at home.  The patient and her husband felt that gabapentin may be contributing to a lot of her symptoms.  Her husband states that she did not have significant problems with her gait, balance and memory until she was placed on gabapentin.  He also notes that since she has been on gabapentin she has had some personality changes as well.  The patient would like to wean off of gabapentin to see if her symptoms improve.  She returns today for evaluation.  HISTORY 06/10/17: Ms. Secrest is a 77-year-old female with a history of benign essential tremor, chronic low back pain abnormality of gait. She returns today for follow-up.  reports that she feels that she has gotten worse. States that she is now having to use a spoon when eating due to the tremor. He reports that he feels her balance is worse however the patient doesn't necessarily feel that way. She's had 2 falls recently. He states that the worse [place] the beach. She does do water aerobics weekly. She denies any dizzy episodes. She continues on gabapentin 300 mg in the morning 600 mg at night. She states that she never increased the  gabapentin as she cannot tolerate the drowsiness. She uses a cane when ambulating. She returns today for an evaluation   REVIEW OF SYSTEMS: Out of a complete 14 system review of symptoms, the patient complains only of the following symptoms, and all other reviewed systems are negative.  Back pain, diarrhea  ALLERGIES: Allergies  Allergen Reactions  . Sulfa Antibiotics Hives    HOME MEDICATIONS: Outpatient Medications Prior to Visit  Medication Sig Dispense Refill  . acetaminophen (TYLENOL) 500 MG tablet Take 500 mg by mouth every 6 (six) hours as needed.    . Calcium Carbonate-Vitamin D 600-400 MG-UNIT tablet Take 1 tablet by mouth daily.    . cholestyramine (QUESTRAN) 4 g packet Take 4 g by mouth daily.    . Cyanocobalamin (B-12) 1000 MCG/ML KIT Inject 1,000 mcg as directed every 30 (thirty) days.    . diphenhydramine-acetaminophen (TYLENOL PM) 25-500 MG TABS tablet Take 1 tablet by mouth at bedtime as needed.    . doxycycline (MONODOX) 100 MG capsule Take by mouth.    . gabapentin (NEURONTIN) 300 MG capsule TAKE 1 CAPSULE BY MOUTH IN THE MORNING AND 2 CAPSULES AT NIGHT 270 capsule 1  . loratadine (CLARITIN) 10 MG tablet Take 10 mg by mouth daily as needed.     . meloxicam (MOBIC) 7.5 MG tablet Take 1 tablet by mouth 2 (two) times daily.     . mirabegron ER (MYRBETRIQ) 25   MG TB24 tablet Take 25 mg by mouth daily.    Marland Kitchen omeprazole (PRILOSEC) 40 MG capsule Take 40 mg by mouth daily.    Marland Kitchen gabapentin (NEURONTIN) 300 MG capsule TAKE 1 CAPSULE BY MOUTH IN THE MORNING AND 2 CAPSULES AT NIGHT (Patient taking differently: 2 (two) times daily. ) 270 capsule 3   No facility-administered medications prior to visit.     PAST MEDICAL HISTORY: Past Medical History:  Diagnosis Date  . Abnormality of gait   . Arthritis   . Chronic diarrhea   . Chronic low back pain   . 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 GERD (gastroesophageal reflux disease)   . Herniated nucleus  pulposus   . Obesity   . PONV (postoperative nausea and vomiting)   . Wears glasses     PAST SURGICAL HISTORY: Past Surgical History:  Procedure Laterality Date  . ABDOMINAL HYSTERECTOMY    . BACK SURGERY  2005  . BREAST SURGERY     lumpectomy and biopsy  . COLONOSCOPY    . [place]  . LUMBAR LAMINECTOMY/DECOMPRESSION MICRODISCECTOMY Right 11/20/2015   Procedure: Laminectomy and Foraminotomy - L4-L5 - L3-L4 - right Lumbar Four-Five Discectomy with Lumbar Three-Four Decompression;  Surgeon: Kary Kos, MD;  Location: [facility];  Service: Neurosurgery;  Laterality: Right;  . ROTATOR CUFF REPAIR  2010   right  . TONSILLECTOMY      FAMILY HISTORY: Family History  Problem Relation Age of Onset  . Stroke Mother   . Leukemia Father   . Lymphoma Sister   . Stroke Sister   . Dementia Sister   . Tremor Neg Hx     SOCIAL HISTORY: Social History   Socioeconomic History  . Marital status: Married    Spouse name: Fritz Pickerel  . Number of children: 2  . Years of education: hs  . Highest education level: Not on file  Occupational History  . Occupation: Retired  Scientific laboratory technician  . Financial resource strain: Not on file  . Food insecurity:    Worry: Not on file    Inability: Not on file  . Transportation needs:    Medical: Not on file    Non-medical: Not on file  Tobacco Use  . Smoking status: Never Smoker  . Smokeless tobacco: Never Used  Substance and Sexual Activity  . Alcohol use: Yes    Comment: occasional  . Drug use: No  . Sexual activity: Not on file  Lifestyle  . Physical activity:    Days per week: Not on file    Minutes per session: Not on file  . Stress: Not on file  Relationships  . Social connections:    Talks on phone: Not on file    Gets together: Not on file    Attends religious service: Not on file    Active member of club or organization: Not on file    Attends meetings of clubs or organizations: Not on file    Relationship status: Not  on file  . Intimate partner violence:    Fear of current or ex partner: Not on file    Emotionally abused: Not on file    Physically abused: Not on file    Forced sexual activity: Not on file  Other Topics Concern  . Not on file  Social History Narrative   Lives with husband      PHYSICAL EXAM  Vitals:   12/08/17 0849  BP: 137/85  Pulse:  68  Weight: 242 lb (109.8 kg)   Body mass index is 41.54 kg/m.  Generalized: Well developed, in no acute distress, obese  Neurological examination  Mentation: Alert oriented to time, place, history taking. Follows all commands speech and language fluent Cranial nerve II-XII: Pupils were equal round reactive to light. Extraocular movements were full, visual field were full on confrontational test. Facial sensation and strength were normal. Uvula tongue midline. Head turning and shoulder shrug  were normal and symmetric. Motor: The motor testing reveals 5 over 5 strength of all 4 extremities. Good symmetric motor tone is noted throughout.  Sensory: Sensory testing is intact to soft touch on all 4 extremities. No evidence of extinction is noted.  Coordination: Cerebellar testing reveals good finger-nose-finger and heel-to-shin bilaterally.  Action tremor noted in the hands bilaterally. Gait and station: Patient uses a cane when ambulating.  Tandem gait not attempted. Reflexes: Deep tendon reflexes are symmetric and normal bilaterally.   DIAGNOSTIC DATA (LABS, IMAGING, TESTING) - I reviewed patient records, labs, notes, testing and imaging myself where available.  Lab Results  Component Value Date   WBC 8.2 06/27/2017   HGB 14.4 06/27/2017   HCT 43.6 06/27/2017   MCV 82.8 06/27/2017   PLT 199 06/27/2017      Component Value Date/Time   NA 142 12/26/2016 1228   K 4.6 12/26/2016 1228   CL 108 11/15/2015 1254   CO2 27 12/26/2016 1228   GLUCOSE 98 12/26/2016 1228   BUN 22.8 12/26/2016 1228   CREATININE 0.8 12/26/2016 1228   CALCIUM 9.5  12/26/2016 1228   PROT 7.5 06/10/2016 1529   ALBUMIN 3.9 06/10/2016 1529   AST 15 06/10/2016 1529   ALT 18 06/10/2016 1529   ALKPHOS 75 06/10/2016 1529   BILITOT 1.27 (H) 06/10/2016 1529   GFRNONAA >60 11/15/2015 1254   GFRAA >60 11/15/2015 1254    Lab Results  Component Value Date   VITAMINB12 254 04/05/2014   Lab Results  Component Value Date   TSH 3.560 04/05/2014      ASSESSMENT AND PLAN 76 y.o. year old female  has a past medical history of Abnormality of gait, Arthritis, Chronic diarrhea, Chronic low back pain, Essential and other specified forms of tremor (04/05/2014), GERD (gastroesophageal reflux disease), Herniated nucleus pulposus, Obesity, PONV (postoperative nausea and vomiting), and Wears glasses. here with:  1.  Essential tremor 2.  Abnormality of gait  We will try weaning the patient off of gabapentin.  She will reduce her dose to 300 mg twice a day for the next 2 weeks.  If she tolerates the reduction well she will call and we will continue to further reduce gabapentin until its discontinued.  If her tremor significantly worsens off of gabapentin we may try primidone.  Patient voiced understanding.  She will return in 6 months or sooner if needed.  I spent 25 minutes with the patient. 50% of this time was spent discussing medication   Megan Millikan, MSN, NP-C 12/08/2017, 9:00 AM [facility] [address], [address] [place], [place] [address] [phone]   

## 2017-12-18 ENCOUNTER — Other Ambulatory Visit (HOSPITAL_BASED_OUTPATIENT_CLINIC_OR_DEPARTMENT_OTHER): Payer: Self-pay | Admitting: Student

## 2017-12-18 DIAGNOSIS — M5416 Radiculopathy, lumbar region: Secondary | ICD-10-CM

## 2017-12-27 ENCOUNTER — Ambulatory Visit (HOSPITAL_BASED_OUTPATIENT_CLINIC_OR_DEPARTMENT_OTHER)
Admission: RE | Admit: 2017-12-27 | Discharge: 2017-12-27 | Disposition: A | Discharge status: Home / Self Care | Payer: Medicare Other | Source: Ambulatory Visit | Attending: Student | Admitting: Student

## 2017-12-27 DIAGNOSIS — M5136 Other intervertebral disc degeneration, lumbar region: Secondary | ICD-10-CM | POA: Insufficient documentation

## 2017-12-27 DIAGNOSIS — M5126 Other intervertebral disc displacement, lumbar region: Secondary | ICD-10-CM | POA: Diagnosis not present

## 2017-12-27 DIAGNOSIS — M48061 Spinal stenosis, lumbar region without neurogenic claudication: Secondary | ICD-10-CM | POA: Insufficient documentation

## 2017-12-27 DIAGNOSIS — M5416 Radiculopathy, lumbar region: Secondary | ICD-10-CM | POA: Diagnosis present

## 2017-12-27 DIAGNOSIS — R590 Localized enlarged lymph nodes: Secondary | ICD-10-CM | POA: Insufficient documentation

## 2017-12-27 MED ORDER — GADOBENATE DIMEGLUMINE 529 MG/ML IV SOLN
20.0000 mL | Freq: Once | INTRAVENOUS | Status: AC | PRN
  Administered 2017-12-27: 20 mL via INTRAVENOUS

## 2017-12-29 ENCOUNTER — Ambulatory Visit: Payer: Medicare Other | Admitting: Oncology

## 2017-12-29 ENCOUNTER — Other Ambulatory Visit: Payer: Medicare Other

## 2017-12-30 ENCOUNTER — Telehealth: Payer: Self-pay | Admitting: Adult Health

## 2017-12-30 ENCOUNTER — Other Ambulatory Visit: Payer: Self-pay | Admitting: Neurology

## 2017-12-30 MED ORDER — GABAPENTIN 300 MG PO CAPS: ORAL_CAPSULE | ORAL | 0 refills | Status: DC

## 2017-12-30 NOTE — Telephone Encounter (Signed)
Called the patient and made her aware that Jinny Blossom looked over and states that she can increase to the on in the morning and then 2 in the evening of the medication and see if she gets relief from that. The pt states that when she was at that dose she was falling flat on her face. I informed her that we do not want her falling so to please call us and keep [place] informed if she starts experience falls or any problems. Pt had a mri completed on the 13th with her neuro surgery. She is still waiting for those results.

## 2017-12-30 NOTE — Telephone Encounter (Signed)
Pt called stating that since her last visit and reduced dose for gabapentin she hasnt been able to use one of her legs very well. Stating that it has made it difficult to walk and do everyday activities. Pt would like a call to discuss upping the dose of gabapentin.

## 2017-12-30 NOTE — Telephone Encounter (Signed)
Ok to increase dose the dose back

## 2018-06-15 ENCOUNTER — Telehealth: Payer: Self-pay | Admitting: *Deleted

## 2018-06-15 NOTE — Telephone Encounter (Signed)
Called pt. Offered tomorrow at 12pm with Dr. Jannifer Franklin. He has cx and she was on wait list. She declined. She already has a therapy appt. She had a hip replacement recently.

## 2018-06-17 ENCOUNTER — Ambulatory Visit: Payer: Medicare Other | Admitting: Adult Health

## 2018-08-28 ENCOUNTER — Other Ambulatory Visit: Payer: Self-pay | Admitting: Adult Health

## 2018-09-04 ENCOUNTER — Telehealth: Payer: Self-pay | Admitting: Adult Health

## 2018-09-04 MED ORDER — GABAPENTIN 300 MG PO CAPS: ORAL_CAPSULE | ORAL | 0 refills | Status: DC

## 2018-09-04 NOTE — Telephone Encounter (Signed)
t requesting a call stating she is still taking medication gabapentin (NEURONTIN) 300 MG capsule and will needing refills sent to [facility]

## 2018-09-04 NOTE — Telephone Encounter (Signed)
Refilled today by NP.

## 2018-09-17 ENCOUNTER — Encounter

## 2018-09-17 ENCOUNTER — Ambulatory Visit (INDEPENDENT_AMBULATORY_CARE_PROVIDER_SITE_OTHER): Payer: Medicare Other | Admitting: Neurology

## 2018-09-17 ENCOUNTER — Encounter: Payer: Self-pay | Admitting: Neurology

## 2018-09-17 VITALS — BP 125/78 | HR 76 | Ht 64.0 in | Wt 224.0 lb

## 2018-09-17 DIAGNOSIS — G250 Essential tremor: Secondary | ICD-10-CM

## 2018-09-17 MED ORDER — PROPRANOLOL HCL 20 MG PO TABS: 20.0000 mg | ORAL_TABLET | Freq: Two times a day (BID) | ORAL | 3 refills | Status: DC

## 2018-09-17 NOTE — Patient Instructions (Addendum)
We will start propranolol for the tremor.   Inderal (propranolol) is a blood pressure medication that is commonly used for migraine headaches. This is a type of beta blocker. The most common side effects include low heart rate, dizziness, fatigue, and increased depression. This medication may worsen asthma. If you believe that you are having side effects on this medication, please contact our office.

## 2018-09-17 NOTE — Progress Notes (Signed)
Reason for visit: Essential tremor, chronic low back pain  Angela Frederick is an 78 y.o. female  History of present illness:  Angela Frederick is a 78 year old left-handed white female with a history of chronic low back pain, followed through neurosurgery.  The patient is on gabapentin taking 300 mg the morning and 600 mg in the evening.  She just recently had a left total hip replacement.  She has had a lot of fatigue following the surgery.  The patient continues to have tremors involving both upper extremities, worse on the left.  The patient is somewhat disabled when it comes to her abilities to perform handwriting, she has difficulty feeding herself.  The patient has only tried gabapentin so far for the tremors.  She returns to this office for an evaluation.  Past Medical History:  Diagnosis Date  . Abnormality of gait   . Arthritis   . Chronic diarrhea   . Chronic low back pain   . 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 GERD (gastroesophageal reflux disease)   . Herniated nucleus pulposus   . Obesity   . PONV (postoperative nausea and vomiting)   . Wears glasses     Past Surgical History:  Procedure Laterality Date  . ABDOMINAL HYSTERECTOMY    . BACK SURGERY  2005  . BREAST SURGERY     lumpectomy and biopsy  . COLONOSCOPY    . [place]  . LUMBAR LAMINECTOMY/DECOMPRESSION MICRODISCECTOMY Right 11/20/2015   Procedure: Laminectomy and Foraminotomy - L4-L5 - L3-L4 - right Lumbar Four-Five Discectomy with Lumbar Three-Four Decompression;  Surgeon: Kary Kos, MD;  Location: [place] [facility];  Service: Neurosurgery;  Laterality: Right;  . ROTATOR CUFF REPAIR  2010   right  . TONSILLECTOMY      Family History  Problem Relation Age of Onset  . Stroke Mother   . Leukemia Father   . Lymphoma Sister   . Stroke Sister   . Dementia Sister   . Tremor Neg Hx     Social history:  reports that she has never smoked. She has never used smokeless  tobacco. She reports current alcohol use. She reports that she does not use drugs.    Allergies  Allergen Reactions  . Sulfa Antibiotics Hives    Medications:  Prior to Admission medications   Medication Sig Start Date End Date Taking? Authorizing Provider  acetaminophen (TYLENOL) 500 MG tablet Take 500 mg by mouth every 6 (six) hours as needed.   Yes [provider]  Calcium Carbonate-Vitamin D 600-400 MG-UNIT tablet Take 1 tablet by mouth daily.   Yes [provider]  Cyanocobalamin (B-12) 1000 MCG/ML KIT Inject 1,000 mcg as directed every 30 (thirty) days.   Yes [provider]  diphenhydramine-acetaminophen (TYLENOL PM) 25-500 MG TABS tablet Take 1 tablet by mouth at bedtime as needed.   Yes [provider]  gabapentin (NEURONTIN) 300 MG capsule Take 300 mg in the morning and then 600 mg in the evening 09/04/18  Yes Ward Givens, NP  loratadine (CLARITIN) 10 MG tablet Take 10 mg by mouth daily as needed.    Yes [provider]  meloxicam (MOBIC) 7.5 MG tablet Take 1 tablet by mouth 2 (two) times daily.  01/27/14  Yes [provider]  omeprazole (PRILOSEC) 40 MG capsule Take 40 mg by mouth daily.   Yes [provider]    ROS:  Out of a complete 14 system review of symptoms,  the patient complains only of the following symptoms, and all other reviewed systems are negative.  Fatigue Joint pain, back pain, walking difficulty Tremors  Blood pressure 125/78, pulse 76, height '5\' 4"'  (1.626 m), weight 224 lb (101.6 kg).  Physical Exam  General: The patient is alert and cooperative at the time of the examination.  The patient is markedly obese.  Skin: No significant peripheral edema is noted.   Neurologic Exam  Mental status: The patient is alert and oriented x 3 at the time of the examination. The patient has apparent normal recent and remote memory, with an apparently normal attention span and concentration  ability.   Cranial nerves: Facial symmetry is present. Speech is normal, no aphasia or dysarthria is noted. Extraocular movements are full. Visual fields are full.  Motor: The patient has good strength in all 4 extremities.  Sensory examination: Soft touch sensation is symmetric on the face, arms, and legs.  Coordination: The patient has good finger-nose-finger and heel-to-shin bilaterally.  The patient has a significant tremor with finger-nose-finger over the left arm, mild on the right, the patient has a lot of difficulty performing handwriting with drawing a spiral with the left arm.  Gait and station: The patient uses a cane for ambulation, gait is slightly wide-based.  Romberg is negative. No drift is seen.  Reflexes: Deep tendon reflexes are symmetric.   Assessment/Plan:  1.  Essential tremor  2.  Chronic low back pain  The patient will continue on her gabapentin, we will add propranolol taking 20 mg twice daily, she will call for any dose adjustments.  The patient has a significant left arm tremor, a deep brain stimulator may offer benefit if the oral medications are not helpful.  She will follow-up in 6 months.   Jill Alexanders MD 09/17/2018 2:36 PM  [facility] [address] [place], [place] [address]  Phone [phone] Fax [phone]

## 2018-09-18 ENCOUNTER — Ambulatory Visit: Payer: Medicare Other | Admitting: Neurology

## 2018-12-15 ENCOUNTER — Other Ambulatory Visit: Payer: Self-pay | Admitting: Adult Health

## 2019-04-22 ENCOUNTER — Other Ambulatory Visit: Payer: Self-pay

## 2019-04-22 ENCOUNTER — Encounter: Payer: Self-pay | Admitting: Neurology

## 2019-04-22 ENCOUNTER — Ambulatory Visit (INDEPENDENT_AMBULATORY_CARE_PROVIDER_SITE_OTHER): Payer: Medicare Other | Admitting: Neurology

## 2019-04-22 VITALS — BP 122/75 | HR 81 | Temp 97.7°F | Ht 64.0 in | Wt 220.0 lb

## 2019-04-22 DIAGNOSIS — G250 Essential tremor: Secondary | ICD-10-CM

## 2019-04-22 MED ORDER — PROPRANOLOL HCL 20 MG PO TABS: ORAL_TABLET | ORAL | 3 refills | Status: DC

## 2019-04-22 NOTE — Patient Instructions (Signed)
We will restart propranolol for the tremor, call for any dose adjustments.   Inderal (propranolol) is a blood pressure medication that is commonly used for migraine headaches. This is a type of beta blocker. The most common side effects include low heart rate, dizziness, fatigue, and increased depression. This medication may worsen asthma. If you believe that you are having side effects on this medication, please contact our office.

## 2019-04-22 NOTE — Progress Notes (Signed)
Reason for visit: Essential tremor  Angela Frederick is an 78 y.o. female  History of present illness:  Angela Frederick is a 78 year old left-handed white female with a history of an essential tremor affecting both upper extremities.  The patient has had ongoing issues with tremor, she is started on propranolol when seen last at the 20 mg twice daily dose but she took 1 month supply and never got it refilled, she tolerated the drug well but she did not note any benefit with the medication.  She still has troubles with handwriting and with feeding herself.  The patient does have some low back pain, she has had a hip replacement in the past.  She uses a cane for ambulation.  She has not had any falls.  Her husband is concerned about some memory issues that she has had over the last several years.  He is not clear that she has progressed with her memory but she is seems to have some short-term memory problems at times.  Past Medical History:  Diagnosis Date  . Abnormality of gait   . Arthritis   . Chronic diarrhea   . Chronic low back pain   . 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 GERD (gastroesophageal reflux disease)   . Herniated nucleus pulposus   . Obesity   . PONV (postoperative nausea and vomiting)   . Wears glasses     Past Surgical History:  Procedure Laterality Date  . ABDOMINAL HYSTERECTOMY    . BACK SURGERY  2005  . BREAST SURGERY     lumpectomy and biopsy  . COLONOSCOPY    . [place]  . LUMBAR LAMINECTOMY/DECOMPRESSION MICRODISCECTOMY Right 11/20/2015   Procedure: Laminectomy and Foraminotomy - L4-L5 - L3-L4 - right Lumbar Four-Five Discectomy with Lumbar Three-Four Decompression;  Surgeon: Kary Kos, MD;  Location: [place] [facility];  Service: Neurosurgery;  Laterality: Right;  . ROTATOR CUFF REPAIR  2010   right  . TONSILLECTOMY      Family History  Problem Relation Age of Onset  . Stroke Mother   . Leukemia Father   . Lymphoma  Sister   . Stroke Sister   . Dementia Sister   . Tremor Neg Hx     Social history:  reports that she has never smoked. She has never used smokeless tobacco. She reports current alcohol use. She reports that she does not use drugs.    Allergies  Allergen Reactions  . Sulfa Antibiotics Hives    Medications:  Prior to Admission medications   Medication Sig Start Date End Date Taking? Authorizing Provider  acetaminophen (TYLENOL) 500 MG tablet Take 500 mg by mouth every 6 (six) hours as needed.   Yes [provider]  Calcium Carbonate-Vitamin D 600-400 MG-UNIT tablet Take 1 tablet by mouth daily.   Yes [provider]  Cyanocobalamin (B-12) 1000 MCG/ML KIT Inject 1,000 mcg as directed every 30 (thirty) days.   Yes [provider]  diphenhydramine-acetaminophen (TYLENOL PM) 25-500 MG TABS tablet Take 1 tablet by mouth at bedtime as needed.   Yes [provider]  gabapentin (NEURONTIN) 300 MG capsule TAKE 1 CAPSULE BY MOUTH IN THE MORNING AND 2 CAPSULES IN THE EVENING 12/15/18  Yes Kathrynn Ducking, MD  loratadine (CLARITIN) 10 MG tablet Take 10 mg by mouth daily as needed.    Yes [provider]  meloxicam (MOBIC) 7.5 MG tablet Take 1 tablet by mouth 2 (two) times daily.  01/27/14  Yes [provider]  omeprazole (PRILOSEC) 40 MG capsule Take 40 mg by mouth daily.   Yes [provider]    ROS:  Out of a complete 14 system review of symptoms, the patient complains only of the following symptoms, and all other reviewed systems are negative.  Memory disturbance Tremor Low back pain  Blood pressure 122/75, pulse 81, temperature 97.7 F (36.5 C), height _0  (1.626 m), weight 220 lb (99.8 kg).  Physical Exam  General: The patient is alert and cooperative at the time of the examination.  The patient is moderately obese.  Skin: No significant peripheral edema is noted.   Neurologic Exam  Mental status: The patient is  alert and oriented x 3 at the time of the examination. The Mini-Mental status examination done today shows a total score 27/30.   Cranial nerves: Facial symmetry is present. Speech is normal, no aphasia or dysarthria is noted. Extraocular movements are full. Visual fields are full.  Motor: The patient has good strength in all 4 extremities.  Sensory examination: Soft touch sensation is symmetric on the face, arms, and legs.  Coordination: The patient has good finger-nose-finger and heel-to-shin bilaterally.  An intention tremor seen with finger-nose-finger laterally.  With drawing a spiral, the tremor is translated into the handwriting.  Gait and station: The patient has a slightly wide-based gait, she walks with a cane normally. Romberg is negative. No drift is seen.  Reflexes: Deep tendon reflexes are symmetric.   Assessment/Plan:  1.  Essential tremor  2.  Reports of mild memory disturbance  The patient will be placed back on propranolol working up to 40 mg twice daily, she will call for any dose adjustments.  The memory issue will be followed over time, will follow-up here in about 6 months.  Jill Alexanders MD 04/22/2019 1:58 PM  [facility] [address] [place], [place] [address]  Phone [phone] Fax [phone]

## 2019-05-19 ENCOUNTER — Telehealth: Payer: Self-pay | Admitting: Neurology

## 2019-05-19 MED ORDER — PROPRANOLOL HCL 20 MG PO TABS: ORAL_TABLET | ORAL | 1 refills | Status: DC

## 2019-05-19 NOTE — Telephone Encounter (Signed)
Chart reviewed and refill for Propranolol is appropriate. Rx has been submitted.

## 2019-05-19 NOTE — Addendum Note (Signed)
Addended by: Verlin Grills T on: 05/19/2019 09:01 AM   Modules accepted: Orders

## 2019-05-19 NOTE — Telephone Encounter (Signed)
Pt has called to inform that she is responding well to the propranolol (INDERAL) 20 MG tablet, she would like to know if a 90 day script can be called in to [facility]

## 2019-06-15 ENCOUNTER — Other Ambulatory Visit: Payer: Self-pay | Admitting: Neurology

## 2019-11-02 ENCOUNTER — Ambulatory Visit (INDEPENDENT_AMBULATORY_CARE_PROVIDER_SITE_OTHER): Payer: Medicare Other | Admitting: Neurology

## 2019-11-02 ENCOUNTER — Other Ambulatory Visit: Payer: Self-pay

## 2019-11-02 ENCOUNTER — Encounter: Payer: Self-pay | Admitting: Neurology

## 2019-11-02 VITALS — BP 95/67 | HR 65 | Temp 96.9°F | Ht 64.0 in | Wt 218.0 lb

## 2019-11-02 DIAGNOSIS — G250 Essential tremor: Secondary | ICD-10-CM

## 2019-11-02 MED ORDER — PROPRANOLOL HCL 20 MG PO TABS: ORAL_TABLET | ORAL | 1 refills | Status: DC

## 2019-11-02 MED ORDER — GABAPENTIN 300 MG PO CAPS: ORAL_CAPSULE | ORAL | 1 refills | Status: DC

## 2019-11-02 NOTE — Progress Notes (Signed)
I have read the note, and I agree with the clinical assessment and plan.  Harvie Morua K Trusten Hume   

## 2019-11-02 NOTE — Progress Notes (Signed)
PATIENT: Angela Frederick DOB: [birth year]  REASON FOR VISIT: follow up HISTORY FROM: patient  HISTORY OF PRESENT ILLNESS: Today 11/02/19  Angela Frederick is a 79 year old female with history of an essential tremor affecting both upper extremities.  She has had some mild concerns of memory, feels stable.  She was unable to tolerate propanolol 40 mg twice a day, due to report of headache.  She is taking 20 mg twice a day, tolerating well, thinks is helpful.  She remains on gabapentin, is actually taking 300 mg twice a day.  She feels the tremor is stable, hand writing table today is easily legible.  She had a fall in December, injured her coccyx.  She uses a cane.  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  She drives a car, does all of her ADLs, her daughters look after her closely.  The gabapentin is also helpful for her back.  Repeat blood pressure 105/70.  She presents today for evaluation accompanied by her husband.  HISTORY 04/22/2019 Dr. Jannifer Franklin: Angela Frederick is a 79 year old left-handed white female with a history of an essential tremor affecting both upper extremities.  The patient has had ongoing issues with tremor, she is started on propranolol when seen last at the 20 mg twice daily dose but she took 1 month supply and never got it refilled, she tolerated the drug well but she did not note any benefit with the medication.  She still has troubles with handwriting and with feeding herself.  The patient does have some low back pain, she has had a hip replacement in the past.  She uses a cane for ambulation.  She has not had any falls.  Her husband is concerned about some memory issues that she has had over the last several years.  He is not clear that she has progressed with her memory but she is seems to have some short-term memory problems at times   REVIEW OF SYSTEMS: Out of a complete 14 system review of symptoms, the patient complains only of  the following symptoms, and all other reviewed systems are negative.  Tremor  ALLERGIES: Allergies  Allergen Reactions  . Sulfa Antibiotics Hives    HOME MEDICATIONS: Outpatient Medications Prior to Visit  Medication Sig Dispense Refill  . acetaminophen (TYLENOL) 500 MG tablet Take 500 mg by mouth every 6 (six) hours as needed.    . Calcium Carbonate-Vitamin D 600-400 MG-UNIT tablet Take 1 tablet by mouth daily.    . Cyanocobalamin (B-12) 1000 MCG/ML KIT Inject 1,000 mcg as directed every 30 (thirty) days.    . diphenhydramine-acetaminophen (TYLENOL PM) 25-500 MG TABS tablet Take 1 tablet by mouth at bedtime as needed.    . gabapentin (NEURONTIN) 300 MG capsule TAKE 1 CAPSULE BY MOUTH EVERY MORNING & TAKE 2 CAPSULES EVERY EVENING 270 capsule 1  . loratadine (CLARITIN) 10 MG tablet Take 10 mg by mouth daily as needed.     . meloxicam (MOBIC) 7.5 MG tablet Take 1 tablet by mouth 2 (two) times daily.     Marland Kitchen omeprazole (PRILOSEC) 40 MG capsule Take 40 mg by mouth daily.    . propranolol (INDERAL) 20 MG tablet 2 tablets twice a day (Patient taking differently: 2 tablets daily) 360 tablet 1   No facility-administered medications prior to visit.    PAST MEDICAL HISTORY: Past Medical History:  Diagnosis Date  . Abnormality of gait   . Arthritis   .  Chronic diarrhea   . Chronic low back pain   . 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 GERD (gastroesophageal reflux disease)   . Herniated nucleus pulposus   . Obesity   . PONV (postoperative nausea and vomiting)   . Wears glasses     PAST SURGICAL HISTORY: Past Surgical History:  Procedure Laterality Date  . ABDOMINAL HYSTERECTOMY    . BACK SURGERY  2005  . BREAST SURGERY     lumpectomy and biopsy  . COLONOSCOPY    . [place]  . LUMBAR LAMINECTOMY/DECOMPRESSION MICRODISCECTOMY Right 11/20/2015   Procedure: Laminectomy and Foraminotomy - L4-L5 - L3-L4 - right Lumbar Four-Five Discectomy with  Lumbar Three-Four Decompression;  Surgeon: Kary Kos, MD;  Location: [facility];  Service: Neurosurgery;  Laterality: Right;  . ROTATOR CUFF REPAIR  2010   right  . TONSILLECTOMY      FAMILY HISTORY: Family History  Problem Relation Age of Onset  . Stroke Mother   . Leukemia Father   . Lymphoma Sister   . Stroke Sister   . Dementia Sister   . Tremor Neg Hx     SOCIAL HISTORY: Social History   Socioeconomic History  . Marital status: Married    Spouse name: Angela Frederick  . Number of children: 2  . Years of education: hs  . Highest education level: Not on file  Occupational History  . Occupation: Retired  Tobacco Use  . Smoking status: Never Smoker  . Smokeless tobacco: Never Used  Substance and Sexual Activity  . Alcohol use: Yes    Comment: occasional  . Drug use: No  . Sexual activity: Not on file  Other Topics Concern  . Not on file  Social History Narrative   Lives with husband   Social Determinants of Health   Financial Resource Strain:   . Difficulty of Paying Living Expenses: Not on file  Food Insecurity:   . Worried About Charity fundraiser in the Last Year: Not on file  . Ran Out of Food in the Last Year: Not on file  Transportation Needs:   . Lack of Transportation (Medical): Not on file  . Lack of Transportation (Non-Medical): Not on file  Physical Activity:   . Days of Exercise per Week: Not on file  . Minutes of Exercise per Session: Not on file  Stress:   . Feeling of Stress : Not on file  Social Connections:   . Frequency of Communication with Friends and Family: Not on file  . Frequency of Social Gatherings with Friends and Family: Not on file  . Attends Religious Services: Not on file  . Active Member of Clubs or Organizations: Not on file  . Attends Archivist Meetings: Not on file  . Marital Status: Not on file  Intimate Partner Violence:   . Fear of Current or Ex-Partner: Not on file  . Emotionally Abused: Not on file  .  Physically Abused: Not on file  . Sexually Abused: Not on file   PHYSICAL EXAM  Vitals:   11/02/19 1253  BP: 95/67  Pulse: 65  Temp: (!) 96.9 F (36.1 C)  Weight: 218 lb (98.9 kg)  Height: 5' 4" (1.626 m)   Body mass index is 37.42 kg/m. BP 105/70 manual repeat  Generalized: Well developed, in no acute distress   Neurological examination  Mentation: Alert oriented to time, place, history taking. Follows all commands speech and language fluent Cranial nerve II-XII:  Pupils were equal round reactive to light. Extraocular movements were full, visual field were full on confrontational test. Facial sensation and strength were normal. Head turning and shoulder shrug were normal and symmetric. Motor: Good strength in all extremities. Sensory: Sensory testing is intact to soft touch on all 4 extremities. No evidence of extinction is noted.  Coordination: Cerebellar testing reveals good finger-nose-finger bilaterally, significant tremor with finger-nose-finger on the left arm, mild on the right, handwriting sample was easily legible Gait and station: Uses a cane for ambulation, is slightly wide-based Reflexes: Deep tendon reflexes are symmetric   DIAGNOSTIC DATA (LABS, IMAGING, TESTING) - I reviewed patient records, labs, notes, testing and imaging myself where available.  Lab Results  Component Value Date   WBC 8.2 06/27/2017   HGB 14.4 06/27/2017   HCT 43.6 06/27/2017   MCV 82.8 06/27/2017   PLT 199 06/27/2017      Component Value Date/Time   NA 142 12/26/2016 1228   K 4.6 12/26/2016 1228   CL 108 11/15/2015 1254   CO2 27 12/26/2016 1228   GLUCOSE 98 12/26/2016 1228   BUN 22.8 12/26/2016 1228   CREATININE 0.8 12/26/2016 1228   CALCIUM 9.5 12/26/2016 1228   PROT 7.5 06/10/2016 1529   ALBUMIN 3.9 06/10/2016 1529   AST 15 06/10/2016 1529   ALT 18 06/10/2016 1529   ALKPHOS 75 06/10/2016 1529   BILITOT 1.27 (H) 06/10/2016 1529   GFRNONAA >60 11/15/2015 1254   GFRAA >60  11/15/2015 1254   No results found for: CHOL, HDL, LDLCALC, LDLDIRECT, TRIG, CHOLHDL No results found for: HGBA1C Lab Results  Component Value Date   VITAMINB12 254 04/05/2014   Lab Results  Component Value Date   TSH 3.560 04/05/2014   ASSESSMENT AND PLAN 78 y.o. year old female  has a past medical history of Abnormality of gait, Arthritis, Chronic diarrhea, Chronic low back pain, Essential and other specified forms of tremor (04/05/2014), GERD (gastroesophageal reflux disease), Herniated nucleus pulposus, Obesity, PONV (postoperative nausea and vomiting), and Wears glasses. here with:  1.  Essential tremor 2.  Chronic low back pain  She will remain on gabapentin, along with propanolol 20 mg twice a day.  She could not tolerate higher doses.  Overall, her tremor is stable, has not worsened.  She is not to the point where she feels she needs a deep brain stimulator.  She is able to do all of her daily activities.  She will follow-up in 8 months or sooner if needed.   I spent 15 minutes with the patient. 50% of this time was spent discussing her plan of care.   Sarah Slack, AGNP-C, DNP 11/02/2019, 1:02 PM [facility] [address], [address] [place], [place] [address] [phone]   

## 2019-11-02 NOTE — Patient Instructions (Signed)
It was great to see you today! Let's keep gabapentin at current dose, take propranolol 20 mg twice daily. See you back in 8 months or sooner if needed :)

## 2020-04-08 ENCOUNTER — Other Ambulatory Visit: Payer: Self-pay | Admitting: Neurology

## 2020-07-03 ENCOUNTER — Ambulatory Visit: Payer: Medicare Other | Admitting: Neurology

## 2020-07-07 ENCOUNTER — Other Ambulatory Visit: Payer: Self-pay | Admitting: Neurology

## 2020-09-26 ENCOUNTER — Other Ambulatory Visit: Payer: Self-pay | Admitting: Neurology

## 2020-09-30 ENCOUNTER — Other Ambulatory Visit: Payer: Self-pay | Admitting: Neurology

## 2020-11-07 ENCOUNTER — Other Ambulatory Visit: Payer: Self-pay | Admitting: Neurology

## 2020-12-04 ENCOUNTER — Other Ambulatory Visit: Payer: Self-pay | Admitting: Neurology

## 2021-05-31 ENCOUNTER — Other Ambulatory Visit: Payer: Self-pay | Admitting: Neurology

## 2023-12-16 DEATH — deceased
